# Patient Record
Sex: Male | Born: 1935 | Race: White | Hispanic: No | Marital: Married | Smoking: Former smoker
Health system: Southern US, Community
[De-identification: ages and names within clinical notes are randomized; demographics above are authoritative.]

## PROBLEM LIST (undated history)

## (undated) DIAGNOSIS — G7000 Myasthenia gravis without (acute) exacerbation: Secondary | ICD-10-CM

## (undated) DIAGNOSIS — R4781 Slurred speech: Secondary | ICD-10-CM

## (undated) HISTORY — DX: Myasthenia gravis without (acute) exacerbation: G70.00

## (undated) HISTORY — PX: TONSILLECTOMY: SUR1361

## (undated) HISTORY — DX: Slurred speech: R47.81

---

## 2013-10-23 ENCOUNTER — Encounter (HOSPITAL_COMMUNITY): Payer: Self-pay | Admitting: Emergency Medicine

## 2013-10-23 ENCOUNTER — Inpatient Hospital Stay (HOSPITAL_COMMUNITY)
Admission: EM | Admit: 2013-10-23 | Discharge: 2013-10-30 | DRG: 057 | Disposition: A | Discharge status: Home / Self Care | Payer: Medicare Other | Attending: Internal Medicine | Admitting: Internal Medicine

## 2013-10-23 DIAGNOSIS — G7000 Myasthenia gravis without (acute) exacerbation: Secondary | ICD-10-CM | POA: Diagnosis not present

## 2013-10-23 DIAGNOSIS — R1310 Dysphagia, unspecified: Secondary | ICD-10-CM | POA: Diagnosis not present

## 2013-10-23 DIAGNOSIS — Z79899 Other long term (current) drug therapy: Secondary | ICD-10-CM

## 2013-10-23 DIAGNOSIS — E785 Hyperlipidemia, unspecified: Secondary | ICD-10-CM

## 2013-10-23 DIAGNOSIS — H532 Diplopia: Secondary | ICD-10-CM

## 2013-10-23 DIAGNOSIS — M5382 Other specified dorsopathies, cervical region: Secondary | ICD-10-CM

## 2013-10-23 DIAGNOSIS — R490 Dysphonia: Secondary | ICD-10-CM

## 2013-10-23 LAB — BASIC METABOLIC PANEL
Anion gap: 12 (ref 5–15)
BUN: 21 mg/dL (ref 6–23)
CO2: 25 mEq/L (ref 19–32)
Calcium: 9.3 mg/dL (ref 8.4–10.5)
Chloride: 101 mEq/L (ref 96–112)
Creatinine, Ser: 1.02 mg/dL (ref 0.50–1.35)
GFR, EST AFRICAN AMERICAN: 79 mL/min — AB (ref >90)
GFR, EST NON AFRICAN AMERICAN: 68 mL/min — AB (ref >90)
Glucose, Bld: 98 mg/dL (ref 70–99)
POTASSIUM: 3.8 meq/L (ref 3.7–5.3)
Sodium: 138 mEq/L (ref 137–147)

## 2013-10-23 LAB — CBC
HCT: 43.7 % (ref 39.0–52.0)
Hemoglobin: 15.3 g/dL (ref 13.0–17.0)
MCH: 30.3 pg (ref 26.0–34.0)
MCHC: 35 g/dL (ref 30.0–36.0)
MCV: 86.5 fL (ref 78.0–100.0)
Platelets: 210 10*3/uL (ref 150–400)
RBC: 5.05 MIL/uL (ref 4.22–5.81)
RDW: 13.5 % (ref 11.5–15.5)
WBC: 6.6 10*3/uL (ref 4.0–10.5)

## 2013-10-23 NOTE — ED Notes (Signed)
Pt reports difficulty swallowing x 1 week, slurred speech onset yesterday, neck pain x 3 weeks.  No facial droop, equal grips, tongue midline.

## 2013-10-24 ENCOUNTER — Emergency Department (HOSPITAL_COMMUNITY): Payer: Medicare Other

## 2013-10-24 ENCOUNTER — Encounter (HOSPITAL_COMMUNITY): Payer: Self-pay | Admitting: Student

## 2013-10-24 DIAGNOSIS — Z79899 Other long term (current) drug therapy: Secondary | ICD-10-CM | POA: Diagnosis not present

## 2013-10-24 DIAGNOSIS — R1310 Dysphagia, unspecified: Secondary | ICD-10-CM

## 2013-10-24 DIAGNOSIS — G7000 Myasthenia gravis without (acute) exacerbation: Secondary | ICD-10-CM | POA: Diagnosis present

## 2013-10-24 DIAGNOSIS — E785 Hyperlipidemia, unspecified: Secondary | ICD-10-CM | POA: Diagnosis present

## 2013-10-24 LAB — CBC
HEMATOCRIT: 47 % (ref 39.0–52.0)
Hemoglobin: 16.3 g/dL (ref 13.0–17.0)
MCH: 30.1 pg (ref 26.0–34.0)
MCHC: 34.7 g/dL (ref 30.0–36.0)
MCV: 86.7 fL (ref 78.0–100.0)
Platelets: 236 10*3/uL (ref 150–400)
RBC: 5.42 MIL/uL (ref 4.22–5.81)
RDW: 13.6 % (ref 11.5–15.5)
WBC: 9.1 10*3/uL (ref 4.0–10.5)

## 2013-10-24 LAB — HEPATIC FUNCTION PANEL
ALBUMIN: 4.2 g/dL (ref 3.5–5.2)
ALT: 22 U/L (ref 0–53)
AST: 40 U/L — AB (ref 0–37)
Alkaline Phosphatase: 102 U/L (ref 39–117)
TOTAL PROTEIN: 7.9 g/dL (ref 6.0–8.3)
Total Bilirubin: 0.7 mg/dL (ref 0.3–1.2)

## 2013-10-24 LAB — CREATININE, SERUM
Creatinine, Ser: 1.01 mg/dL (ref 0.50–1.35)
GFR calc Af Amer: 79 mL/min — ABNORMAL LOW (ref >90)
GFR, EST NON AFRICAN AMERICAN: 69 mL/min — AB (ref >90)

## 2013-10-24 LAB — TSH: TSH: 2.89 u[IU]/mL (ref 0.350–4.500)

## 2013-10-24 MED ORDER — SODIUM CHLORIDE 0.9 % IV SOLN
INTRAVENOUS | Status: DC
  Administered 2013-10-24 – 2013-10-25 (×2): via INTRAVENOUS

## 2013-10-24 MED ORDER — ASPIRIN EC 81 MG PO TBEC
81.0000 mg | DELAYED_RELEASE_TABLET | Freq: Every day | ORAL | Status: DC
  Administered 2013-10-24 – 2013-10-30 (×5): 81 mg via ORAL
  Filled 2013-10-24 (×5): qty 1

## 2013-10-24 MED ORDER — SENNOSIDES-DOCUSATE SODIUM 8.6-50 MG PO TABS: 1.0000 | ORAL_TABLET | Freq: Every evening | ORAL | Status: DC | PRN

## 2013-10-24 MED ORDER — SODIUM CHLORIDE 0.9 % IV SOLN: INTRAVENOUS | Status: DC

## 2013-10-24 MED ORDER — ALUM & MAG HYDROXIDE-SIMETH 200-200-20 MG/5ML PO SUSP: 30.0000 mL | Freq: Four times a day (QID) | ORAL | Status: DC | PRN

## 2013-10-24 MED ORDER — ACETAMINOPHEN 325 MG PO TABS: 650.0000 mg | ORAL_TABLET | Freq: Four times a day (QID) | ORAL | Status: DC | PRN

## 2013-10-24 MED ORDER — PYRIDOSTIGMINE BROMIDE 60 MG PO TABS
60.0000 mg | ORAL_TABLET | Freq: Three times a day (TID) | ORAL | Status: DC
  Administered 2013-10-24: 60 mg via ORAL
  Filled 2013-10-24 (×5): qty 1

## 2013-10-24 MED ORDER — PNEUMOCOCCAL VAC POLYVALENT 25 MCG/0.5ML IJ INJ
0.5000 mL | INJECTION | INTRAMUSCULAR | Status: AC
  Administered 2013-10-25: 0.5 mL via INTRAMUSCULAR

## 2013-10-24 MED ORDER — ONDANSETRON HCL 4 MG/2ML IJ SOLN: 4.0000 mg | Freq: Four times a day (QID) | INTRAMUSCULAR | Status: DC | PRN

## 2013-10-24 MED ORDER — ACETAMINOPHEN 650 MG RE SUPP: 650.0000 mg | Freq: Four times a day (QID) | RECTAL | Status: DC | PRN

## 2013-10-24 MED ORDER — ONDANSETRON HCL 4 MG PO TABS: 4.0000 mg | ORAL_TABLET | Freq: Four times a day (QID) | ORAL | Status: DC | PRN

## 2013-10-24 MED ORDER — ENOXAPARIN SODIUM 40 MG/0.4ML ~~LOC~~ SOLN
40.0000 mg | SUBCUTANEOUS | Status: DC
  Administered 2013-10-24 – 2013-10-29 (×6): 40 mg via SUBCUTANEOUS
  Filled 2013-10-24 (×6): qty 0.4

## 2013-10-24 NOTE — Progress Notes (Signed)
Pt came in pacing with difficulty about staying. Wife emotional

## 2013-10-24 NOTE — ED Notes (Signed)
Dr. Denton LankSteinl made aware of continued swallowing difficulties. Apple sauce sticks back their. Gags, and talks.  Based on Steinl's evaluation of our swallow eval. And delay in follow up, pt. To be admitted.

## 2013-10-24 NOTE — Evaluation (Addendum)
Clinical/Bedside Swallow Evaluation Patient Details  Name: Joe PernaByron Palos MRN: [id number] Date of Birth: 10-23-1932  Today's Date: 10/24/2013 Time: 1710-1757 SLP Time Calculation (min): 47 min  Past Medical History: History reviewed. No pertinent past medical history. Past Surgical History:  Past Surgical History  Procedure Laterality Date  . Tonsillectomy     HPI:  Joe Fisher is an 80 y.o. male who noted 3-4 weeks ago he was having a hard time keeping his head held in the upright position.  He states he is able to lift his head but after a while it will "droop forward".  Wife states he spends most of the day with his head flexed forward. Over the last 2 weeks wife has noted he has been having difficulty swallowing solid foods to the point she would chop all his meats up finely for fear he may choke. As patient will denise all symptoms asked about, most of further history was obtained from wife. She endorses that he has had periods of double vision which he states objects are vertically stacked in addition he has complained about his "jaw becoming tired the more he will eat food".  Both patient and wife denies any periods of SOB, weakness in legs or inability to climb stairs or do physical activity. Currently patient states he feels fine and desires to go home   Assessment / Plan / Recommendation Clinical Impression  Patient is currently unable to swallow purees or solids, with purees sticking in throat and pt gagging each bolus back into the oral cavity.  Suspect laryngeal residue with thicker boluses due to decreased base of tongue contraction to the phayrngeal wall to propel the bolus through the crichopharyngeus.  Pt is able to clear small sips of water with double swallows without overt s/s of aspiration.  Pt may have small sips of H2O or an occassional ice chip, but otherwise, recommend NPO.  SLP noted that pt is unable to bite his teeth together, and becomes quickly fatigue when attempting  to chew, even to the point of manually closing his jaw with his fist, similar to myasthenia gravis.  Discussed with Neurology.  SLP will follow up 10/13 for potential readiness for po's.  He will most likely need MBS for objective study of swallow function prior to placement on diet.    Aspiration Risk  Severe   Diet Recommendation NPO;Ice chips PRN after oral care   Liquid Administration via: Spoon Medication Administration: Via alternative means    Other  Recommendations Recommended Consults: MBS Oral Care Recommendations: Oral care Q4 per protocol   Follow Up Recommendations  24 hour supervision/assistance    Frequency and Duration min 2x/week  2 weeks   Pertinent Vitals/Pain Neck pain secondary to head drop.        Swallow Study Prior Functional Status       General HPI: Joe PernaByron Matkins is an 80 y.o. male who noted 3-4 weeks ago he was having a hard time keeping his head held in the upright position.  He states he is able to lift his head but after a while it will "droop forward".  Wife states he spends most of the day with his head flexed forward. Over the last 2 weeks wife has noted he has been having difficulty swallowing solid foods to the point she would chop all his meats up finely for fear he may choke. As patient will denise all symptoms asked about, most of further history was obtained from wife. She endorses that he has  had periods of double vision which he states objects are vertically stacked in addition he has complained about his "jaw becoming tired the more he will eat food".  Both patient and wife denies any periods of SOB, weakness in legs or inability to climb stairs or do physical activity. Currently patient states he feels fine and desires to go home Type of Study: Bedside swallow evaluation Previous Swallow Assessment: none Diet Prior to this Study: NPO (sips/chips) Temperature Spikes Noted: No Respiratory Status: Room air History of Recent Intubation:  No Behavior/Cognition: Alert;Cooperative;Pleasant mood Oral Cavity - Dentition: Adequate natural dentition Self-Feeding Abilities: Able to feed self Patient Positioning: Upright in bed Baseline Vocal Quality: Clear Volitional Cough: Weak Volitional Swallow: Able to elicit    Oral/Motor/Sensory Function Overall Oral Motor/Sensory Function: Impaired Labial ROM: Within Functional Limits Labial Symmetry: Within Functional Limits Labial Strength: Within Functional Limits Lingual ROM: Reduced right;Reduced left (unable to elevate tongue tip) Lingual Symmetry: Within Functional Limits Lingual Strength: Reduced Lingual Sensation: Within Functional Limits Facial ROM: Within Functional Limits Facial Symmetry: Right drooping eyelid;Left drooping eyelid Facial Strength: Reduced Facial Sensation: Within Functional Limits Velum: Impaired right;Impaired left (hypernasal) Mandible: Impaired (unable to bite teeth together, fatigue with chewing)   Ice Chips Ice chips: Within functional limits Presentation: Spoon   Thin Liquid Thin Liquid: Impaired Presentation: Spoon;Cup;Straw Pharyngeal  Phase Impairments: Decreased hyoid-laryngeal movement;Multiple swallows;Throat Clearing - Immediate (c/o pharyngeal residue)    Nectar Thick Nectar Thick Liquid: Not tested   Honey Thick Honey Thick Liquid: Not tested   Puree Puree: Impaired Presentation: Spoon Pharyngeal Phase Impairments: Decreased hyoid-laryngeal movement;Multiple swallows;Throat Clearing - Delayed (brings bolus back into oral cavity after swallow attempts)   Solid   GO    Solid: Not tested       Maryjo RochesterWillis, Rheannon Cerney T 10/24/2013,5:57 PM

## 2013-10-24 NOTE — Progress Notes (Signed)
Nif= -65 Vc= 6.5L

## 2013-10-24 NOTE — ED Provider Notes (Addendum)
Signed out by Dr Wilkie AyeHorton at 0800 that pt presents w mild dysphonia and intermittent trouble swallowing solid foods in past few weeks, without o acute or abrupt change today, and that MRI was pending and to d/c to home if/when MRI neg for acute cva.  MRI negative.  Pt states is able to eat/drink fine, although notes occasional trouble whereby solid foods will feel they transiently get hung up or stuck.  No current throat or esophageal fb sensation, and is swallowing and handling secretions without difficulty. Pt denies sense of neck or throat swelling or pain. No tongue swelling. Denies sob. Denies choking or gagging.   Pt states speech has sounded slightly different during this period, but no expressive aphasia or slurred/dysarthric speech. No extremity numbness/weakness, no change in gait, no tremors, no loss of normal functional ability.  On repeat questioning, pt states has occasionally noticed 'double vision', although not consistently so, and spouse adds some recent trouble holding head up, that on occasion it will nod forward. On exam, no ptosis, normal eom. No focal weakness. Pt ambulates w steady gait.   Discussed w pt and spouse MRI neg for cva.  Discussed diff dx incl motor neuron disease, MG, and variety of ENT/GI causes of dysphagia/dysphonia. Will consult neuro re symptoms,possible/suspected MG.  If neuro workup neg, will need outpatient ENT/GI eval for dysphagia/dysphonia.   Discussed pt with Dr Reynolds/neurology including concern for MG - she indicates further neuro eval, outpatient workup.  On recheck pt, pt fails swallow screen, and not able to swallow solid, apple sauce, etc. Spouse concerned re not getting nutrition and feels swallowing getting worse.   Will call med service for further inpatient eval, incl neuro eval.        Joe RootsKevin E Geoge Lawrance, MD 10/24/13 1106

## 2013-10-24 NOTE — Progress Notes (Signed)
MD: Patient failed bedside swallow eval per speech. Called Pharmacy to discuss and see if patient could be changed to IV/IM form. Pharmacist stated that it's more dangerous to give in those forms and to consult with MD prior to changing. Patient pulled out IV access and refuses restart. At the current time he is fixated on going home. Nurse will continue to monitor and report to am nurse for follow up with MD.

## 2013-10-24 NOTE — ED Provider Notes (Signed)
CSN: [id number]     Arrival date & time 10/23/13  1829 History   First MD Initiated Contact with Patient 10/23/13 2337     Chief Complaint  Patient presents with  . Dysphagia  . Aphasia  . Neck Pain     (Consider location/radiation/quality/duration/timing/severity/associated sxs/prior Treatment) HPI  This is a 80 year old male who presents with dysphasia and slurred speech. Onset of symptoms 3 weeks ago.  Patient reports difficulty swallowing solids started approximately 3 weeks ago. He denies any difficulties swallowing liquids. Wife is noted over the last 3 days that he has had slurred speech. Patient denies any unilateral weakness, numbness, or tingling. He denies any vision changes. Patient does report waxing and waning neck pain during this time but states he's not having any neck pain right now. Patient denies any sore throat or pain with swallowing. Patient has a history of hyperlipidemia and is on Crestor but otherwise has no history of stroke, hypertension, coronary artery disease.  History reviewed. No pertinent past medical history. Past Surgical History  Procedure Laterality Date  . Tonsillectomy     No family history on file. History  Substance Use Topics  . Smoking status: Never Smoker   . Smokeless tobacco: Not on file  . Alcohol Use: No    Review of Systems  Constitutional: Negative.  Negative for fever.  Respiratory: Negative.  Negative for chest tightness and shortness of breath.   Cardiovascular: Negative.  Negative for chest pain.  Gastrointestinal: Negative.  Negative for nausea, vomiting, abdominal pain and diarrhea.       Difficulty swallowing  Genitourinary: Negative.  Negative for dysuria.  Musculoskeletal: Positive for neck pain. Negative for back pain.  Skin: Negative for rash.  Neurological: Positive for speech difficulty. Negative for headaches.  Psychiatric/Behavioral: Negative for confusion.  All other systems reviewed and are  negative.     Allergies  Review of patient's allergies indicates no known allergies.  Home Medications   Prior to Admission medications   Medication Sig Start Date End Date Taking? Authorizing Provider  CALCIUM PO Take 1 tablet by mouth daily.   Yes Historical Provider, MD  rosuvastatin (CRESTOR) 10 MG tablet Take 10 mg by mouth daily.   Yes Historical Provider, MD   BP 129/69  Pulse 58  Temp(Src) 98.1 F (36.7 C) (Oral)  Resp 17  Ht 6\' 2"  (1.88 m)  Wt 182 lb (82.555 kg)  BMI 23.36 kg/m2  SpO2 97% Physical Exam  Nursing note and vitals reviewed. Constitutional: He is oriented to person, place, and time. He appears well-developed and well-nourished. No distress.  HENT:  Head: Normocephalic and atraumatic.  Mouth/Throat: Oropharynx is clear and moist.  Uvula midline, no oropharyngeal swelling noted  Eyes: Pupils are equal, round, and reactive to light.  Neck: Neck supple. No thyromegaly present.  Cardiovascular: Normal rate, regular rhythm and normal heart sounds.   No murmur heard. Pulmonary/Chest: Effort normal and breath sounds normal. No respiratory distress. He has no wheezes.  Abdominal: Soft. Bowel sounds are normal. There is no tenderness. There is no rebound.  Musculoskeletal: He exhibits no edema.  Lymphadenopathy:    He has no cervical adenopathy.  Neurological: He is alert and oriented to person, place, and time.  5 out of 5 strength in all 4 extremities, cranial nerves II through XII intact, no dysmetria to finger-nose-finger, patient fluent, he can name and repeat  Skin: Skin is warm and dry.  Psychiatric: He has a normal mood and affect.  ED Course  Procedures (including critical care time) Labs Review Labs Reviewed  BASIC METABOLIC PANEL - Abnormal; Notable for the following:    GFR calc non Af Amer 68 (*)    GFR calc Af Amer 79 (*)    All other components within normal limits  CBC    Imaging Review Ct Head Wo Contrast  10/24/2013   CLINICAL  DATA:  Difficulty swallowing for 1 week. Acute onset of slurred speech yesterday. Neck pain for 3 weeks. Initial encounter.  EXAM: CT HEAD WITHOUT CONTRAST  TECHNIQUE: Contiguous axial images were obtained from the base of the skull through the vertex without intravenous contrast.  COMPARISON:  None.  FINDINGS: There is mild cerebral and cerebellar atrophy with diffuse sulcal prominence. Gray-white differentiation is grossly preserved. No CT evidence of acute large territory infarct. No intraparenchymal or extra-axial mass or hemorrhage. Normal size and configuration of the ventricles and basilar cisterns. No midline shift.  Limited visualization of the paranasal sinuses is normal. There is scattered opacification of the bilateral mastoid air cells, right greater than left.  There is slightly nodular asymmetric soft tissue thickening about the right parietal calvarium (image 21, series 2). This finding without associated radiopaque foreign body or displaced calvarial fracture.  IMPRESSION: 1. Mild atrophy without acute intracranial process. 2. Bilateral mastoid air cell effusions, right greater than left - nonspecific though could be seen in the setting of mastoiditis.   Electronically Signed   By: Simonne ComeJohn  Watts M.D.   On: 10/24/2013 00:39     EKG Interpretation   Date/Time:  Monday October 24 2013 00:14:15 EDT Ventricular Rate:  61 PR Interval:  184 QRS Duration: 107 QT Interval:  427 QTC Calculation: 430 R Axis:   14 Text Interpretation:  Sinus rhythm No prior for comparison Confirmed by  HORTON  MD, COURTNEY ([id number]) on 10/24/2013 12:37:16 AM      MDM   Final diagnoses:  None    Patient presents with difficulty swallowing and slurred speech. He is nontoxic on exam. No obvious focal abnormalities. I do not appreciate any dysarthria; however, patient's wife is at the bedside and states that his speech is different than normal.  Considerations include stroke versus achalasia causing difficulty  swallowing.  Initial workup including CBC, BMP, and noncontrasted head CT is reassuring. Discuss with Dr. Leroy Kennedyamilo, on-call neurologist. Given that there is speech disturbance in addition to difficulty swallowing and symptoms are ongoing, would elect to get MRI. If negative. Patient will likely need to followup with GI to be evaluated for achalasia.  Patient is hesitant to stay.  Discuss with him the risk of leaving without getting an MRI.  Patient is agreeable to stay in emergency department overnight for MRI in the morning.  Signed out to Dr. Arizona ConstableStienl.    Shon Batonourtney F Horton, MD 10/24/13 [id number]

## 2013-10-24 NOTE — Discharge Instructions (Signed)
It was our pleasure to provide your ER care today - we hope that you feel better.  We discussed your case with our neurologist, who indicates for you to follow up with neurology as outpatient in the coming week - see referral - call office today arrange appointment.  When you call, tell them that you were in the ER today for evaluation of trouble swallowing and change in voice, have had some recent double vision, that your case was discussed with our neurologist, and that your case was discussed with our neurologist who recommended close outpatient neurology evaluation (to rule out myasthenia gravis, and/or other neurologic causes of your symptoms).   Also follow up with gi doctor this week regarding your swallowing difficulties - see referral - call office today to arrange appointment.  Return to ER right away if worse, trouble breathing, unable to swallow, new neurological symptoms (one-sided numbness or weakness, trouble walking, new change in vision/speech), other concern.        Dysphagia Swallowing problems (dysphagia) occur when solids and liquids seem to stick in your throat on the way down to your stomach, or the food takes longer to get to the stomach. Other symptoms include regurgitating food, noises coming from the throat, chest discomfort with swallowing, and a feeling of fullness or the feeling of something being stuck in your throat when swallowing. When blockage in your throat is complete, it may be associated with drooling. CAUSES  Problems with swallowing may occur because of problems with the muscles. The food cannot be propelled in the usual manner into your stomach. You may have ulcers, scar tissue, or inflammation in the tube down which food travels from your mouth to your stomach (esophagus), which blocks food from passing normally into the stomach. Causes of inflammation include:  Acid reflux from your stomach into your esophagus.  Infection.  Radiation treatment for  cancer.  Medicines taken without enough fluids to wash them down into your stomach. You may have nerve problems that prevent signals from being sent to the muscles of your esophagus to contract and move your food down to your stomach. Globus pharyngeus is a relatively common problem in which there is a sense of an obstruction or difficulty in swallowing, without any physical abnormalities of the swallowing passages being found. This problem usually improves over time with reassurance and testing to rule out other causes. DIAGNOSIS Dysphagia can be diagnosed and its cause can be determined by tests in which you swallow a white substance that helps illuminate the inside of your throat (contrast medium) while X-rays are taken. Sometimes a flexible telescope that is inserted down your throat (endoscopy) to look at your esophagus and stomach is used. TREATMENT   If the dysphagia is caused by acid reflux or infection, medicines may be used.  If the dysphagia is caused by problems with your swallowing muscles, swallowing therapy may be used to help you strengthen your swallowing muscles.  If the dysphagia is caused by a blockage or mass, procedures to remove the blockage may be done. HOME CARE INSTRUCTIONS  Try to eat soft food that is easier to swallow and check your weight on a daily basis to be sure that it is not decreasing.  Be sure to drink liquids when sitting upright (not lying down). SEEK MEDICAL CARE IF:  You are losing weight because you are unable to swallow.  You are coughing when you drink liquids (aspiration).  You are coughing up partially digested food. SEEK IMMEDIATE MEDICAL  CARE IF:  You are unable to swallow your own saliva .  You are having shortness of breath or a fever, or both.  You have a hoarse voice along with difficulty swallowing. MAKE SURE YOU:  Understand these instructions.  Will watch your condition.  Will get help right away if you are not doing well  or get worse. Document Released: 12/28/1999 Document Revised: 05/16/2013 Document Reviewed: 06/18/2012 [facility] Patient Information 2015 [employer] [place]. This information is not intended to replace advice given to you by your health care provider. Make sure you discuss any questions you have with your health care provider.

## 2013-10-24 NOTE — Consult Note (Addendum)
NEURO HOSPITALIST CONSULT NOTE    Reason for Consult: difficulty swallowing over a 4 day period.   HPI:                                                                                                                                          Joe Fisher is an 80 y.o. male who noted 3-4 weeks ago he was having a hard time keeping his head held in the upright position.  He states he is able to lift his head but after a while it will "droop forward".  Wife states he spends most of the day with his head flexed forward. Over the last 2 weeks wife has noted he has been having difficulty swallowing solid foods to the point she would chop all his meats up finely for fear he may choke. As patient will denise all symptoms asked about, most of further history was obtained from wife. She endorses that he has had periods of double vision which he states objects are vertically stacked in addition he has complained about his "jaw becoming tired the more he will eat food".  Both patient and wife denies any periods of SOB, weakness in legs or inability to climb stairs or do physical activity. Currently patient states he feels fine and desires to go home.   History reviewed. No pertinent past medical history.  Past Surgical History  Procedure Laterality Date  . Tonsillectomy      Family History  Problem Relation Age of Onset  . Myasthenia gravis Mother    Social History:  reports that he has never smoked. He does not have any smokeless tobacco history on file. He reports that he does not drink alcohol or use illicit drugs.  No Known Allergies  MEDICATIONS:                                                                                                                     No current facility-administered medications for this encounter.   Current Outpatient Prescriptions  Medication Sig Dispense Refill  . CALCIUM PO Take 1 tablet by mouth daily.      . rosuvastatin (CRESTOR) 10 MG  tablet Take 10 mg by mouth daily.          ROS:  History obtained from the patient and and wife  General ROS: negative for - chills, fatigue, fever, night sweats, weight gain or weight loss Psychological ROS: negative for - behavioral disorder, hallucinations, memory difficulties, mood swings or suicidal ideation Ophthalmic ROS: negative for - blurry vision, double vision, eye pain or loss of vision ENT ROS: negative for - epistaxis, nasal discharge, oral lesions, sore throat, tinnitus or vertigo Allergy and Immunology ROS: negative for - hives or itchy/watery eyes Hematological and Lymphatic ROS: negative for - bleeding problems, bruising or swollen lymph nodes Endocrine ROS: negative for - galactorrhea, hair pattern changes, polydipsia/polyuria or temperature intolerance Respiratory ROS: negative for - cough, hemoptysis, shortness of breath or wheezing Cardiovascular ROS: negative for - chest pain, dyspnea on exertion, edema or irregular heartbeat Gastrointestinal ROS: negative for - abdominal pain, diarrhea, hematemesis, nausea/vomiting or stool incontinence Genito-Urinary ROS: negative for - dysuria, hematuria, incontinence or urinary frequency/urgency Musculoskeletal ROS: negative for - joint swelling or muscular weakness Neurological ROS: as noted in HPI Dermatological ROS: negative for rash and skin lesion changes   Blood pressure 125/87, pulse 93, temperature 98 F (36.7 C), temperature source Oral, resp. rate 20, height 6\' 2"  (1.88 m), weight 82.555 kg (182 lb), SpO2 99.00%.   Neurologic Examination:                                                                                                      General: NAD Mental Status: Alert, oriented, thought content appropriate.  Speech fluent without evidence of aphasia--at times it appears he slurs  his speech but this is intermittent.  Able to follow 3 step commands without difficulty. Cranial Nerves: II: Discs flat bilaterally; Visual fields grossly normal, pupils equal, round, reactive to light and accommodation III,IV, VI: ptosis not present --but after having him hold vertical gaze for 30 seconds his right eyelid showed ptosis, extra-ocular motions intact bilaterally V,VII: smile symmetric, facial light touch sensation normal bilaterally VIII: hearing normal bilaterally IX,X: gag reflex present XI: bilateral shoulder shrug XII: midline tongue extension without atrophy or fasciculations --Patient holds head downward but gives full strength on neck flexion and extension   Motor: Right : Upper extremity   5/5    Left:     Upper extremity   5/5  Lower extremity   5/5     Lower extremity   5/5  --with fist pumping there is notable there is a notable fatigability.  Tone and bulk:normal tone throughout; no atrophy noted Sensory: Pinprick and light touch intact throughout, bilaterally Deep Tendon Reflexes:  Right: Upper Extremity   Left: Upper extremity   biceps (C-5 to C-6) 2/4   biceps (C-5 to C-6) 2/4 tricep (C7) 2/4    triceps (C7) 2/4 Brachioradialis (C6) 2/4  Brachioradialis (C6) 2/4  Lower Extremity Lower Extremity  quadriceps (L-2 to L-4) 2/4   quadriceps (L-2 to L-4) 2/4 Achilles (S1) 2/4   Achilles (S1) 2/4  Plantars: Right: downgoing   Left: downgoing Cerebellar: normal finger-to-nose,  normal heel-to-shin test Gait: normal CV: pulses palpable throughout    Lab Results: Basic  Metabolic Panel:  Recent Labs Lab 10/23/13 1844  NA 138  K 3.8  CL 101  CO2 25  GLUCOSE 98  BUN 21  CREATININE 1.02  CALCIUM 9.3    Liver Function Tests: No results found for this basename: AST, ALT, ALKPHOS, BILITOT, PROT, ALBUMIN,  in the last 168 hours No results found for this basename: LIPASE, AMYLASE,  in the last 168 hours No results found for this basename: AMMONIA,   in the last 168 hours  CBC:  Recent Labs Lab 10/23/13 1844  WBC 6.6  HGB 15.3  HCT 43.7  MCV 86.5  PLT 210    Cardiac Enzymes: No results found for this basename: CKTOTAL, CKMB, CKMBINDEX, TROPONINI,  in the last 168 hours  Lipid Panel: No results found for this basename: CHOL, TRIG, HDL, CHOLHDL, VLDL, LDLCALC,  in the last 168 hours  CBG: No results found for this basename: GLUCAP,  in the last 168 hours  Microbiology: No results found for this or any previous visit.  Coagulation Studies: No results found for this basename: LABPROT, INR,  in the last 72 hours  Imaging: Ct Head Wo Contrast  10/24/2013   CLINICAL DATA:  Difficulty swallowing for 1 week. Acute onset of slurred speech yesterday. Neck pain for 3 weeks. Initial encounter.  EXAM: CT HEAD WITHOUT CONTRAST  TECHNIQUE: Contiguous axial images were obtained from the base of the skull through the vertex without intravenous contrast.  COMPARISON:  None.  FINDINGS: There is mild cerebral and cerebellar atrophy with diffuse sulcal prominence. Gray-white differentiation is grossly preserved. No CT evidence of acute large territory infarct. No intraparenchymal or extra-axial mass or hemorrhage. Normal size and configuration of the ventricles and basilar cisterns. No midline shift.  Limited visualization of the paranasal sinuses is normal. There is scattered opacification of the bilateral mastoid air cells, right greater than left.  There is slightly nodular asymmetric soft tissue thickening about the right parietal calvarium (image 21, series 2). This finding without associated radiopaque foreign body or displaced calvarial fracture.  IMPRESSION: 1. Mild atrophy without acute intracranial process. 2. Bilateral mastoid air cell effusions, right greater than left - nonspecific though could be seen in the setting of mastoiditis.   Electronically Signed   By: Simonne Come M.D.   On: 10/24/2013 00:39   Mr Brain Wo Contrast  10/24/2013    CLINICAL DATA:  Slurred speech. Dysphasia. Symptoms began 3 weeks ago. Difficulty swallowing. Speech worsening over the last 3 days.  EXAM: MRI HEAD WITHOUT CONTRAST  TECHNIQUE: Multiplanar, multiecho pulse sequences of the brain and surrounding structures were obtained without intravenous contrast.  COMPARISON:  Head CT ear earlier same day  FINDINGS: Diffusion imaging does not show any acute or subacute infarction. The brainstem is normal. No focal cerebellar insult. The cerebral hemispheres show mild age related atrophy with minimal small vessel change of the white matter, less than often seen in healthy individuals of this age. No cortical or large vessel territory infarction. No mass lesion, hemorrhage, hydrocephalus or extra-axial collection. There is an incidental and insignificant venous angioma at the right parietal vertex with a tiny amount of hemosiderin deposition. This should not be of clinical relevance. No pituitary mass. No inflammatory sinus disease. There is fluid throughout the mastoid air cells bilaterally. This is more extensive on the right.  IMPRESSION: No acute brain pathology. Minimal small vessel change of the cerebral hemispheric white matter, less than often seen in healthy individuals of this age.  Incidental and  likely insignificant venous angioma at the right parietal vertex with a small amount of hemosiderin deposition.  Fluid throughout the mastoid air cells bilaterally, right more than left.   Electronically Signed   By: Paulina FusiMark  Shogry M.D.   On: 10/24/2013 08:22     Felicie MornDavid Smith PA-C [facility] [phone]  10/24/2013, 1:57 PM  Patient seen and examined.  Clinical course and management discussed.  Necessary edits performed.  I agree with the above.  Assessment and plan of care developed and discussed below.    Assessment/Plan: 80 year old male presenting with difficulty swallowing and slurred speech.  The patient's symptoms have been present for the past  few weeks.  There was some question of stroke.  Head CT reviewed and shows no acute changes.  MRI of the brain from today reviewed as well and shows no acute changes.  On neurological examination there is some fatigueability but for the most part he is able to give full strength with all muscle groups tested.  With this is mind a tensilon test would be less than optimal.  Blood work may take quite a few days.  Patient will likely benefit from NCV/EMG which at this time can only be performed on an outpatient basis.    Recommendations: 1.  Swallow evaluation with recommendations by speech therapy 2.  Appointment to be made for NCV/EMG in the next 1-2 days (we will schedule).  Results of this testing should lead further treatment.     Thana FarrLeslie Jahaad Penado, MD [facility] [phone]  10/24/2013  3:40 PM   Addendum: Appointment with Dr. Terrace ArabiaYan [facility] on 10/14 for NCV/EMG at 0815.  Thana FarrLeslie Suraiya Dickerson, MD [facility] [phone]

## 2013-10-24 NOTE — H&P (Signed)
[facility] History and Physical                                                                                    Patient Demographics  Joe Fisher, is a 80 y.o. male  MRN: [id number]   DOB - Jan 12, 1933  Admit Date - 10/23/2013  Outpatient Primary MD for the patient is LITTLE, Murrell ReddenEDGAR W, MD   With History of -  History reviewed. No pertinent past medical history.    Past Surgical History  Procedure Laterality Date  . Tonsillectomy      in for   Chief Complaint  Patient presents with  . Dysphagia  . Aphasia  . Neck Pain     HPI  Joe PernaByron Craigo  is a 80 y.o. male, with recent diagnosis of hyperlipidemia, with no other significant past medical history who presented to the ED with difficulty swallowing foods, slurring of speech, memory loss, and neck stiffness x 2 weeks. While in the ED patient also mentioned a moment of double vision, which is new to him.  Today, he is having difficulty swallowing applesauce, resulting in him gagging per nurse documentation. He denies problems with swallowing applesauce, however his wife reports this has worsened. Wife also reports that his head drops to his chest most of the time. He is able to move his head without difficulty, but does complain of neck stiffness at times. Workup in the ED, including MRI and CT of the head revealed no stroke.      Review of Systems    In addition to the HPI above, No Fever-chills, No Headache, No Chest pain, Cough or Shortness of Breath, No Abdominal pain, No Nausea or Vomiting, Bowel movements are regular, No Blood in stool or Urine, No dysuria, No new skin rashes or bruises, No new joints pains-aches,  No recent weight gain or loss, No polyuria, polydypsia or polyphagia,  A full 10 point Review of Systems was done, except as stated above, all other Review of Systems were negative.   Social History History  Substance Use Topics  . Smoking status: Never Smoker   . Smokeless tobacco: Not on  file  . Alcohol Use: No   Has a 30 pack year of tobacco use. Reports a history of heavy alcohol use, but stopped drinking many years ago.  Family History Mother has a history of myasthenia gravis  Prior to Admission medications   Medication Sig Start Date End Date Taking? Authorizing Provider  CALCIUM PO Take 1 tablet by mouth daily.   Yes Historical Provider, MD  rosuvastatin (CRESTOR) 10 MG tablet Take 10 mg by mouth daily.   Yes Historical Provider, MD    No Known Allergies  Physical Exam  Vitals  Blood pressure 125/87, pulse 93, temperature 98 F (36.7 C), temperature source Oral, resp. rate 20, height 6\' 2"  (1.88 m), weight 82.555 kg (182 lb), SpO2 99.00%.   General: Tall elderly male, Patient slightly agitated, pacing, in NAD, head hanging forward.  Psych: Patient somewhat confused, able to follow demands, Not Suicidal or Homicidal, Awake Alert, Oriented X 3.  Neuro:   No F.N deficits, speech affected, slightly dysarthric. Strength 5/5 all  4 extremities, Sensation intact all 4 extremities. Eyelids weakened. Patient unable to keep them closed against force.  HEENT:  Ears and Eyes appear Normal, Conjunctivae clear, Moist Oral Mucosa. Lower jaw relaxed, and mouth open more than normal.  Neck:  Supple Neck, No JVD, No cervical lymphadenopathy appreciated. Patient holding his head in a downward position through most of the exam.   Respiratory: Symmetrical Chest wall movement, Good air movement bilaterally, Clear to auscultation bilaterally with no wheezes, rhonchi, or rales.  Cardiac:  RRR, S1 and S2 normal, No Gallops, Rubs or Murmurs.  Abdomen:  Positive Bowel Sounds, Abdomen Soft, Non tender, No organomegaly appreciated  Extremities:  Good muscle tone,  joints appear normal , no effusions, Normal ROM.    Data Review  CBC  Recent Labs Lab 10/23/13 1844  WBC 6.6  HGB 15.3  HCT 43.7  PLT 210  MCV 86.5  MCH 30.3  MCHC 35.0  RDW 13.5    ------------------------------------------------------------------------------------------------------------------  Chemistries   Recent Labs Lab 10/23/13 1844  NA 138  K 3.8  CL 101  CO2 25  GLUCOSE 98  BUN 21  CREATININE 1.02  CALCIUM 9.3     Imaging results:   Ct Head Wo Contrast  10/24/2013   CLINICAL DATA:  Difficulty swallowing for 1 week. Acute onset of slurred speech yesterday. Neck pain for 3 weeks. Initial encounter.  EXAM: CT HEAD WITHOUT CONTRAST  TECHNIQUE: Contiguous axial images were obtained from the base of the skull through the vertex without intravenous contrast.  COMPARISON:  None.  FINDINGS: There is mild cerebral and cerebellar atrophy with diffuse sulcal prominence. Gray-white differentiation is grossly preserved. No CT evidence of acute large territory infarct. No intraparenchymal or extra-axial mass or hemorrhage. Normal size and configuration of the ventricles and basilar cisterns. No midline shift.  Limited visualization of the paranasal sinuses is normal. There is scattered opacification of the bilateral mastoid air cells, right greater than left.  There is slightly nodular asymmetric soft tissue thickening about the right parietal calvarium (image 21, series 2). This finding without associated radiopaque foreign body or displaced calvarial fracture.  IMPRESSION: 1. Mild atrophy without acute intracranial process. 2. Bilateral mastoid air cell effusions, right greater than left - nonspecific though could be seen in the setting of mastoiditis.   Electronically Signed   By: Simonne ComeJohn  Watts M.D.   On: 10/24/2013 00:39   Mr Brain Wo Contrast  10/24/2013   CLINICAL DATA:  Slurred speech. Dysphasia. Symptoms began 3 weeks ago. Difficulty swallowing. Speech worsening over the last 3 days.  EXAM: MRI HEAD WITHOUT CONTRAST  TECHNIQUE: Multiplanar, multiecho pulse sequences of the brain and surrounding structures were obtained without intravenous contrast.  COMPARISON:   Head CT ear earlier same day  FINDINGS: Diffusion imaging does not show any acute or subacute infarction. The brainstem is normal. No focal cerebellar insult. The cerebral hemispheres show mild age related atrophy with minimal small vessel change of the white matter, less than often seen in healthy individuals of this age. No cortical or large vessel territory infarction. No mass lesion, hemorrhage, hydrocephalus or extra-axial collection. There is an incidental and insignificant venous angioma at the right parietal vertex with a tiny amount of hemosiderin deposition. This should not be of clinical relevance. No pituitary mass. No inflammatory sinus disease. There is fluid throughout the mastoid air cells bilaterally. This is more extensive on the right.  IMPRESSION: No acute brain pathology. Minimal small vessel change of the cerebral hemispheric white matter,  less than often seen in healthy individuals of this age.  Incidental and likely insignificant venous angioma at the right parietal vertex with a small amount of hemosiderin deposition.  Fluid throughout the mastoid air cells bilaterally, right more than left.   Electronically Signed   By: Paulina Fusi M.D.   On: 10/24/2013 08:22    My personal review of EKG: Rhythm NSR, Rate  61 /min, no Acute ST changes    Assessment & Plan Progressively worsening dysphagia, facial muscle and neck muscle weakness - likely secondary to myasthenia gravis (MRI brain negative for CVA) - Acetylcholine receptor antibodies, and anti-striated antibodies ordered. Neurology consultation pending. Suspect will need to be started on Mestinon. Have ordered  NIF/VC in the ED. Swallow evaluation and physical therapy evaluation will be ordered.  Hyperlipidemia -Recent diagnosis and medication -Hold Crestor while NPO  Social -Wife recovering from a broken hip (may need help at home) -Death of son in 19-May-2013 DVT Prophylaxis: Lovenox  AM Labs Ordered, also please  review Full Orders  Family Communication:   Wife at bedside   Code Status:  Full  Likely DC to  Home  Condition:  guarded  Time spent in minutes : 60  Elvis Coil PA-S2 on 10/24/2013 at 1:10 PM Algis Downs, PA-C  Between 7am to 7pm - Pager [phone]  After 7pm go to [email or web address] - password [email or web address]  And look for the night coverage person covering me after hours  [facility] Office  [phone]  Attending Patient was seen, examined,treatment plan was discussed with the  Advance Practice Provider.  I have directly reviewed the clinical findings, lab, imaging studies and management of this patient in detail. I have made the necessary changes to the above noted documentation, and agree with the documentation, as recorded by the Advance Practice Provider.   Progress of facial muscles, neck muscle weakness with dysphagia. Suspected myasthenia gravis-will start Mestinon. Await neurology evaluation. Myasthenia lab panel ordered.  Windell Norfolk MD [facility].

## 2013-10-25 ENCOUNTER — Inpatient Hospital Stay (HOSPITAL_COMMUNITY): Payer: Medicare Other

## 2013-10-25 DIAGNOSIS — M5382 Other specified dorsopathies, cervical region: Secondary | ICD-10-CM

## 2013-10-25 DIAGNOSIS — G7000 Myasthenia gravis without (acute) exacerbation: Secondary | ICD-10-CM | POA: Diagnosis not present

## 2013-10-25 DIAGNOSIS — H532 Diplopia: Secondary | ICD-10-CM

## 2013-10-25 DIAGNOSIS — R490 Dysphonia: Secondary | ICD-10-CM

## 2013-10-25 LAB — FOLATE RBC: RBC FOLATE: 821 ng/mL — AB (ref >280)

## 2013-10-25 LAB — VITAMIN B12: Vitamin B-12: 1389 pg/mL — ABNORMAL HIGH (ref 211–911)

## 2013-10-25 LAB — RPR

## 2013-10-25 MED ORDER — IMMUNE GLOBULIN (HUMAN) 10 GM/200ML IV SOLN: 400.0000 mg/kg | INTRAVENOUS | Status: DC

## 2013-10-25 MED ORDER — IMMUNE GLOBULIN (HUMAN) 10 GM/100ML IV SOLN
400.0000 mg/kg | INTRAVENOUS | Status: AC
  Administered 2013-10-25 – 2013-10-29 (×5): 30 g via INTRAVENOUS
  Filled 2013-10-25 (×5): qty 300

## 2013-10-25 MED ORDER — PYRIDOSTIGMINE BROMIDE 10 MG/2ML IV SOLN
2.0000 mg | Freq: Three times a day (TID) | INTRAVENOUS | Status: DC
  Administered 2013-10-25 – 2013-10-26 (×3): 2 mg via INTRAVENOUS
  Filled 2013-10-25 (×6): qty 0.4

## 2013-10-25 MED ORDER — DEXTROSE-NACL 5-0.45 % IV SOLN
INTRAVENOUS | Status: DC
  Administered 2013-10-25 – 2013-10-26 (×2): via INTRAVENOUS
  Administered 2013-10-27: 1000 mL via INTRAVENOUS

## 2013-10-25 MED ORDER — DIPHENHYDRAMINE HCL 50 MG/ML IJ SOLN
12.5000 mg | INTRAMUSCULAR | Status: DC
  Administered 2013-10-25 – 2013-10-29 (×5): 12.5 mg via INTRAVENOUS
  Filled 2013-10-25 (×7): qty 1

## 2013-10-25 NOTE — Evaluation (Signed)
Occupational Therapy Evaluation Patient Details Name: Joe PernaByron Wanzer MRN: [id number] DOB: [birth year] Today's Date: 10/25/2013    History of Present Illness 80 y.o. male [facility] with swallowing foods, slurring of speech, memory loss, and neck stiffness x 2 weeks.  CT and MRI negative for stroke and pt suspected by neurology to have myesthenia gravis (testing is pending).  Pt started on IVIG 10/25/13.  Pt with no other significant PMHx listed in chart (per wife this is the first time in his life he has ever been in the hospital).     Clinical Impression   Pt s/p above. Education provided during session. Pt with increased neck flexion due to neck fatigue (pt reports it was worse when OT saw him compared to earlier in the day) during session-may benefit from soft collar and will check on pt tomorrow.     Follow Up Recommendations  No OT follow up;Supervision/Assistance - 24 hour    Equipment Recommendations  None recommended by OT    Recommendations for Other Services       Precautions / Restrictions Precautions Precautions: Other (comment) (swallowing)      Mobility Bed Mobility               General bed mobility comments: not assessed  Transfers Overall transfer level: Independent Equipment used: None                  Balance                                            ADL Overall ADL's : Needs assistance/impaired                     Lower Body Dressing: Supervision/safety;Sit to/from stand   Toilet Transfer: Supervision/safety;Ambulation (bed)       Tub/ Shower Transfer: Supervision/safety;Ambulation     General ADL Comments: Educated on safety tips (safe shoewear, sitting for LB ADLs) and wrote down for pt and also wrote down a few tips to assist with decreased short term memory.      Vision                     Perception     Praxis      Pertinent Vitals/Pain Pain Assessment: No/denies pain      Hand Dominance Right   Extremity/Trunk Assessment Upper Extremity Assessment Upper Extremity Assessment: Overall WFL for tasks assessed   Lower Extremity Assessment Lower Extremity Assessment: Defer to PT evaluation   Cervical / Trunk Assessment Cervical / Trunk Assessment: Other exceptions Cervical / Trunk Exceptions: h/o low back pain, Pt also with quick cervical extensor fatigue after being unsupported at trunk causing increased cervical flexion   Communication Communication Communication: Expressive difficulties;HOH (dysarthria)   Cognition Arousal/Alertness: Awake/alert Behavior During Therapy: WFL for tasks assessed/performed Overall Cognitive Status: History of cognitive impairments - at baseline Area of Impairment: Memory;Safety/judgement;Orientation Orientation Level: Disoriented to;Time   Memory: Decreased short-term memory   Safety/Judgement: Decreased awareness of safety         General Comments       Exercises       Shoulder Instructions      Home Living Family/patient expects to be discharged to:: Private residence Living Arrangements: Spouse/significant other Available Help at Discharge: Family;Available 24 hours/day Type of Home: House Home Access: Stairs to enter  Entrance Stairs-Number of Steps: 6 Entrance Stairs-Rails: Right Home Layout: One level     Bathroom Shower/Tub: Producer, television/film/videoWalk-in shower   Bathroom Toilet: Standard     Home Equipment: Environmental consultantWalker - 2 wheels;Cane - single point;Shower seat;Bedside commode;Wheelchair - manual          Prior Functioning/Environment Level of Independence: Independent        Comments: mows grass, walks up the hill in his backyard.  Loves the mountains, loved to hunt, but stopped.     OT Diagnosis: Generalized weakness   OT Problem List: Decreased strength;Decreased safety awareness;Decreased cognition   OT Treatment/Interventions: DME and/or AE instruction;Self-care/ADL training;Therapeutic  activities;Patient/family education    OT Goals(Current goals can be found in the care plan section) Acute Rehab OT Goals Patient Stated Goal: go home OT Goal Formulation: With patient Time For Goal Achievement: 11/01/13 Potential to Achieve Goals: Good ADL Goals Additional ADL Goal #1: Pt will tolerate use of soft collar to help hold head in upright position.  OT Frequency: Min 1X/week   Barriers to D/C:            Co-evaluation              End of Session Equipment Utilized During Treatment: Gait belt Nurse Communication: Other (comment) (mentioned soft collar to nurse)  Activity Tolerance: Patient tolerated treatment well Patient left: in bed;with call bell/phone within reach   Time: [id number] OT Time Calculation (min): 13 min Charges:  OT General Charges $OT Visit: 1 Procedure OT Evaluation $Initial OT Evaluation Tier I: 1 Procedure G-CodesEarlie Raveling:    Kiyonna Tortorelli L OTR/L [phone] 10/25/2013, 6:16 PM

## 2013-10-25 NOTE — Progress Notes (Signed)
Speech Language Pathology Treatment: Dysphagia  Patient Details Name: Joe PernaByron Fisher MRN: [id number] DOB: 1932/10/25 Today's Date: 10/25/2013 Time: 0930-1010 SLP Time Calculation (min): 40 min  Assessment / Plan / Recommendation Clinical Impression  Patient continues to have difficulty swallowing purees, bringing bolus back into the oral cavity from the pharynx (probably the valleculae) after 2-3 swallow attempts.  Pt swallows water with 2 swallows to clear, and no overt s/s of aspiration.  Pt's speech becomes more dysarthric the more he speaks.  He is unable to bite his teeth together, and continues to use his fist to manually elevate the mandible.  PO meds being given, may need to switch to IV or possibly Panda tube.  MBS scheduled for today at 1330.  Pt asks if he should be exercising his jaw and neck muscles for improved function.  SLP advised pt to rest, as it appears that exertion causes further decline.   HPI HPI: Joe Fisher is an 80 y.o. male who noted 3-4 weeks ago he was having a hard time keeping his head held in the upright position.  He states he is able to lift his head but after a while it will "droop forward".  Wife states he spends most of the day with his head flexed forward. Over the last 2 weeks wife has noted he has been having difficulty swallowing solid foods to the point she would chop all his meats up finely for fear he may choke. As patient will denise all symptoms asked about, most of further history was obtained from wife. She endorses that he has had periods of double vision which he states objects are vertically stacked in addition he has complained about his "jaw becoming tired the more he will eat food".  Both patient and wife denies any periods of SOB, weakness in legs or inability to climb stairs or do physical activity. Currently patient states he feels fine and desires to go home   Pertinent Vitals Pain Assessment: No/denies pain  SLP Plan  MBS    Recommendations  Diet recommendations: NPO Medication Administration: Via alternative means Postural Changes and/or Swallow Maneuvers: Seated upright 90 degrees              Oral Care Recommendations: Oral care Q4 per protocol Follow up Recommendations: 24 hour supervision/assistance Plan: MBS    GO     Maryjo RochesterWillis, Yetunde Leis T 10/25/2013, 10:17 AM

## 2013-10-25 NOTE — Progress Notes (Signed)
NIF -60  VC .90L 

## 2013-10-25 NOTE — Progress Notes (Signed)
CARE MANAGEMENT NOTE 10/25/2013  Patient:  Joe Fisher,Joe Fisher   Account Number:  [id number]  Date Initiated:  10/25/2013  Documentation initiated by:  Jiles CrockerHANDLER,Camren Lipsett  Subjective/Objective Assessment:   ADMITTED WITH Dysphagia,  Aphasia  and  Neck Pain     Action/Plan:   CM FOLLOWING FOR DCP   Anticipated DC Date:  10/27/2013   Anticipated DC Plan:  HOME/SELF CARE     DC Planning Services  CM consult          Status of service:  In process, will continue to follow Medicare Important Message given?   (If response is "NO", the following Medicare IM given date fields will be blank)  Per UR Regulation:  Reviewed for med. necessity/level of care/duration of stay  Comments:  10/13/2015Abelino Derrick- B Liba Hulsey RN,BSN,MHA [phone]

## 2013-10-25 NOTE — Progress Notes (Signed)
NIF -60 VC- 7.0L

## 2013-10-25 NOTE — Progress Notes (Addendum)
Patient ID: Joe PernaByron Gluth  male  ZOX:[id number]    DOB: 1932-08-11    DOA: 10/23/2013  PCP: Fonnie MuLITTLE, EDGAR W, MD    History of present illness Joe Fisher is a 80 y.o. male, with recent diagnosis of hyperlipidemia, with no other significant past medical history who presented to the ED with difficulty swallowing foods, slurring of speech, memory loss, and neck stiffness x 2 weeks. While in the ED patient also mentioned a moment of double vision, which is new to him. Today, he is having difficulty swallowing applesauce, resulting in him gagging per nurse documentation.  Neurology was consulted, suspected myasthenia gravis, MRI negative for acute CVA. Patient started on Mestinon however he is n.p.o. due to difficulty swallowing. Patient underwent repeat swallow study with MBS and did poorly. Will keep NPO with gentle hydration. Mestinon converted to IV.  Neuro to start IVIG.  Assessment/Plan:  Principal Problem: Progressively worsening dysphagia, facial muscle and neck muscle weakness  - likely secondary to myasthenia gravis (MRI brain negative for CVA)  - Acetylcholine receptor antibodies, and anti-striated antibodies ordered.  - Neurology consulted, recommended starting Mestinon, NCV/EMG outpatient - Patient is still n.p.o., ordered MBS today, change to Mestinon to IV dosing until diet order - d/w Dr Amada JupiterKirkpatrick, will start IVIG today   Active problems Hyperlipidemia  -Recent diagnosis and medication, hold Crestor while n.p.o.   DVT Prophylaxis:  Code Status:  Family Communication: Discussed with patient and his wife at the bedside  Disposition:  Consultants:  Neurology  Procedures:  MRI of the brain  Antibiotics:  None    Subjective: Patient seen and examined, feels better wants to go home, however he is still n.p.o. and unable to swallow. Wife at the bedside trying to convince him.  Objective: Weight change: -1.633 kg (-3 lb 9.6 oz)  Intake/Output Summary (Last  24 hours) at 10/25/13 1149 Last data filed at 10/25/13 0909  Gross per 24 hour  Intake      0 ml  Output      0 ml  Net      0 ml   Blood pressure 132/70, pulse 60, temperature 98.4 F (36.9 C), temperature source Oral, resp. rate 18, height 6\' 2"  (1.88 m), weight 80.922 kg (178 lb 6.4 oz), SpO2 98.00%.  Physical Exam: General: Alert and awake, oriented x3, not in any acute distress. CVS: S1-S2 clear, no murmur rubs or gallops Chest: clear to auscultation bilaterally, no wheezing, rales or rhonchi Abdomen: soft nontender, nondistended, normal bowel sounds  Extremities: no cyanosis, clubbing or edema noted bilaterally   Lab Results: Basic Metabolic Panel:  Recent Labs Lab 10/23/13 1844 10/24/13 1407  NA 138  --   K 3.8  --   CL 101  --   CO2 25  --   GLUCOSE 98  --   BUN 21  --   CREATININE 1.02 1.01  CALCIUM 9.3  --    Liver Function Tests:  Recent Labs Lab 10/24/13 1407  AST 40*  ALT 22  ALKPHOS 102  BILITOT 0.7  PROT 7.9  ALBUMIN 4.2   No results found for this basename: LIPASE, AMYLASE,  in the last 168 hours No results found for this basename: AMMONIA,  in the last 168 hours CBC:  Recent Labs Lab 10/23/13 1844 10/24/13 1407  WBC 6.6 9.1  HGB 15.3 16.3  HCT 43.7 47.0  MCV 86.5 86.7  PLT 210 236   Cardiac Enzymes: No results found for this basename: CKTOTAL, CKMB, CKMBINDEX,  TROPONINI,  in the last 168 hours BNP: No components found with this basename: POCBNP,  CBG: No results found for this basename: GLUCAP,  in the last 168 hours   Micro Results: No results found for this or any previous visit (from the past 240 hour(s)).  Studies/Results: Ct Head Wo Contrast  10/24/2013   CLINICAL DATA:  Difficulty swallowing for 1 week. Acute onset of slurred speech yesterday. Neck pain for 3 weeks. Initial encounter.  EXAM: CT HEAD WITHOUT CONTRAST  TECHNIQUE: Contiguous axial images were obtained from the base of the skull through the vertex without  intravenous contrast.  COMPARISON:  None.  FINDINGS: There is mild cerebral and cerebellar atrophy with diffuse sulcal prominence. Gray-white differentiation is grossly preserved. No CT evidence of acute large territory infarct. No intraparenchymal or extra-axial mass or hemorrhage. Normal size and configuration of the ventricles and basilar cisterns. No midline shift.  Limited visualization of the paranasal sinuses is normal. There is scattered opacification of the bilateral mastoid air cells, right greater than left.  There is slightly nodular asymmetric soft tissue thickening about the right parietal calvarium (image 21, series 2). This finding without associated radiopaque foreign body or displaced calvarial fracture.  IMPRESSION: 1. Mild atrophy without acute intracranial process. 2. Bilateral mastoid air cell effusions, right greater than left - nonspecific though could be seen in the setting of mastoiditis.   Electronically Signed   By: Simonne ComeJohn  Watts M.D.   On: 10/24/2013 00:39   Mr Brain Wo Contrast  10/24/2013   CLINICAL DATA:  Slurred speech. Dysphasia. Symptoms began 3 weeks ago. Difficulty swallowing. Speech worsening over the last 3 days.  EXAM: MRI HEAD WITHOUT CONTRAST  TECHNIQUE: Multiplanar, multiecho pulse sequences of the brain and surrounding structures were obtained without intravenous contrast.  COMPARISON:  Head CT ear earlier same day  FINDINGS: Diffusion imaging does not show any acute or subacute infarction. The brainstem is normal. No focal cerebellar insult. The cerebral hemispheres show mild age related atrophy with minimal small vessel change of the white matter, less than often seen in healthy individuals of this age. No cortical or large vessel territory infarction. No mass lesion, hemorrhage, hydrocephalus or extra-axial collection. There is an incidental and insignificant venous angioma at the right parietal vertex with a tiny amount of hemosiderin deposition. This should not be  of clinical relevance. No pituitary mass. No inflammatory sinus disease. There is fluid throughout the mastoid air cells bilaterally. This is more extensive on the right.  IMPRESSION: No acute brain pathology. Minimal small vessel change of the cerebral hemispheric white matter, less than often seen in healthy individuals of this age.  Incidental and likely insignificant venous angioma at the right parietal vertex with a small amount of hemosiderin deposition.  Fluid throughout the mastoid air cells bilaterally, right more than left.   Electronically Signed   By: Paulina FusiMark  Shogry M.D.   On: 10/24/2013 08:22    Medications: Scheduled Meds: . aspirin EC  81 mg Oral Daily  . enoxaparin (LOVENOX) injection  40 mg Subcutaneous Q24H  . pyridostigmine  2 mg Intravenous 3 times per day      LOS: 2 days   Pearlean Sabina M.D. [facility] 10/25/2013, 11:49 AM Pager: [phone]  If 7PM-7AM, please contact night-coverage [email or web address] Password [email or web address]

## 2013-10-25 NOTE — Evaluation (Signed)
Physical Therapy Evaluation Patient Details Name: Joe PernaByron Coco MRN: [id number] DOB: December 08, 1932 Today's Date: 10/25/2013   History of Present Illness  80 [facility]. [facility] with swallowing foods, slurring of speech, memory loss, and neck stiffness x 2 weeks.  CT and MRI negative for stroke and pt suspected by neurology to have myesthenia gravis (testing is pending).  Pt started on IVIG 10/25/13.  Pt with no other significant PMHx listed in chart (per wife this is the first time in his life he has ever been in the hospital).    Clinical Impression  Pt is mobilizing well with normal leg strength and baseline balance.  He does have significant muscle fatigue in his neck by the end of gait with increased neck flexion and decreased ability to extend his head and neck to keep it over his shoulders.  I don't want to do any aggressive neck strengthening exercises right now as it may over fatigue the neck and cause more issues.  If the neck does not return to normal with drug therapy, then he may benefit from OP PT, but will let f/u with neurology MD determine this.     Follow Up Recommendations No PT follow up    Equipment Recommendations  None recommended by PT    Recommendations for Other Services   NA    Precautions / Restrictions Precautions Precautions: Other (comment) (swallowing. ) Precaution Comments: pt on a restricted diet and going to have a swallowing test done this afternoon.       Mobility  Bed Mobility Overal bed mobility: Independent                Transfers Overall transfer level: Independent Equipment used: None                Ambulation/Gait Ambulation/Gait assistance: Supervision Ambulation Distance (Feet): 510 Feet Assistive device: None Gait Pattern/deviations: Staggering left;Staggering right     General Gait Details: very mildly staggering giat pattern, did not require assistance for any LOB.  Also, of note, increased neck  fatigue the further we walked the more flexe his neck became.  Once back in room pt positioned supine in the bed to rest his neck.   Stairs Stairs: Yes Stairs assistance: Modified independent (Device/Increase time) Stair Management: One rail Right;Alternating pattern;Forwards Number of Stairs: 5 General stair comments: pt did rely on rails for steadying assist on stairs, but reports this is normal for him at home.          Balance Overall balance assessment: Needs assistance Sitting-balance support: Feet supported;No upper extremity supported Sitting balance-Leahy Scale: Good     Standing balance support: No upper extremity supported Standing balance-Leahy Scale: Good                   Standardized Balance Assessment Standardized Balance Assessment : Dynamic Gait Index   Dynamic Gait Index Level Surface: Mild Impairment Change in Gait Speed: Normal Gait with Horizontal Head Turns: Normal Gait with Vertical Head Turns: Normal Gait and Pivot Turn: Normal Step Over Obstacle: Normal Step Around Obstacles: Normal Steps: Mild Impairment Total Score: 22       Pertinent Vitals/Pain Pain Assessment: No/denies pain    Home Living Family/patient expects to be discharged to:: Private residence Living Arrangements: Spouse/significant other Available Help at Discharge: Family;Available 24 hours/day Type of Home: House Home Access: Stairs to enter Entrance Stairs-Rails: Right Entrance Stairs-Number of Steps: 6 (garage entry) Home Layout: One level Home Equipment: Dan HumphreysWalker -  2 wheels;Cane - single point;Shower seat;Bedside commode;Wheelchair - manual      Prior Function Level of Independence: Independent         Comments: mows grass, walks up the hill in his backyard.  Loves the mountains, loved to hunt, but stopped.      Hand Dominance   Dominant Hand: Right    Extremity/Trunk Assessment   Upper Extremity Assessment: Defer to OT evaluation            Lower Extremity Assessment: Overall WFL for tasks assessed (5/5 MMT throughout)      Cervical / Trunk Assessment: Other exceptions  Communication   Communication: Expressive difficulties;HOH (dysarthria)  Cognition Arousal/Alertness: Awake/alert Behavior During Therapy: WFL for tasks assessed/performed Overall Cognitive Status: History of cognitive impairments - at baseline Area of Impairment: Problem solving     Memory: Decreased short-term memory       Problem Solving: Slow processing General Comments: Pt seems socially reliant on wife for memory.  He did answer my history questions correctly, but needed extra time to process the month and other basic quuestions (memory retreival vs truely slower processing for everything).              Assessment/Plan    PT Assessment Patent does not need any further PT services  PT Diagnosis Generalized weakness         PT Goals (Current goals can be found in the Care Plan section) Acute Rehab PT Goals Patient Stated Goal: to go home  PT Goal Formulation: No goals set, d/c therapy               End of Session Equipment Utilized During Treatment: Gait belt Activity Tolerance: Patient tolerated treatment well Patient left: in bed;with call bell/phone within reach;with family/visitor present Nurse Communication: Mobility status;Other (comment) (ok to walk in the room and hallway unassisted)         Time: [id number] PT Time Calculation (min): 28 min   Charges:   PT Evaluation $Initial PT Evaluation Tier I: 1 Procedure PT Treatments $Gait Training: 8-22 mins        Labarron Durnin B. Raysha Tilmon, PT, DPT [phone]   10/25/2013, 2:19 PM

## 2013-10-25 NOTE — Progress Notes (Signed)
Subjective: No significant changes overnight. He has ahd a rapidly progressive dysphagia over the past two weeks.   Of note, mother had MG  Exam: Filed Vitals:   10/25/13 1001  BP: 132/70  Pulse: 60  Temp: 98.4 F (36.9 C)  Resp: 18   Gen: In bed, NAD MS: awake, alert, oriented ZO:XWRUECN:PERRL, with sustained upgaze has right mild ptosis and slight dysconjugate gaze/diploplia. Palate moves well.  Motor: 5/5 throughout. Sensory:intact to LT  Impression: 80 yo M with progressive dysphagia and findings on exam very suggestive of myasthenia gravis. If a safe plan for eating can be obtained, then I would agree EMG prior to starting mestinon could be helpful, but if ST cannot come up with safe plan, then would go ahead and start mestinon.   Recommendations: 1) Will await ST evalaution.  2) further recs following ST.   Ritta SlotMcNeill Kirkpatrick, MD [facility] [phone]  If 7pm- 7am, please page neurology on call as listed in AMION.

## 2013-10-26 DIAGNOSIS — G7000 Myasthenia gravis without (acute) exacerbation: Principal | ICD-10-CM

## 2013-10-26 MED ORDER — PYRIDOSTIGMINE BROMIDE 10 MG/2ML IV SOLN
2.0000 mg | Freq: Three times a day (TID) | INTRAVENOUS | Status: DC
  Administered 2013-10-26 – 2013-10-28 (×8): 2 mg via INTRAVENOUS
  Filled 2013-10-26 (×11): qty 0.4

## 2013-10-26 NOTE — Progress Notes (Signed)
Speech Language Pathology Treatment: Dysphagia  Patient Details Name: Joe Fisher MRN: [id number] DOB: 03-15-1932 Today's Date: 10/26/2013 Time: 4098-11911512-1553 SLP Time Calculation (min): 41 min  Assessment / Plan / Recommendation Clinical Impression  Per chart review, pt was made NPO by MD due to difficulty swallowing during lunch meal. Pt today with complaints of increased difficulty keeping his head in an upright position, using his hand to manually lift his head during intake. Pt consumed Dys 1 textures and thin liquids with Mod cues from SLP for use of swallowing strategies recommended during MBS on previous date. Pt with continued c/o residue, intermittent wet vocal quality, and "hocking" cough that ultimately results in oral expectoration of pureed foods. Pt likely spit out the majority of applesauce he consumed during limited trials. Suspect that pt's swallowing musculature is more fatigued today than during MBS yesterday given the above and his subjective reports. Recommend to continue NPO with continued PO trials at bedside while pt continues to undergo treatment.    HPI HPI: Joe Fisher is an 80 y.o. male who noted 3-4 weeks ago he was having a hard time keeping his head held in the upright position.  He states he is able to lift his head but after a while it will "droop forward".  Wife states he spends most of the day with his head flexed forward. Over the last 2 weeks wife has noted he has been having difficulty swallowing solid foods to the point she would chop all his meats up finely for fear he may choke. As patient will denise all symptoms asked about, most of further history was obtained from wife. She endorses that he has had periods of double vision which he states objects are vertically stacked in addition he has complained about his "jaw becoming tired the more he will eat food".  Both patient and wife denies any periods of SOB, weakness in legs or inability to climb stairs or do  physical activity. Currently patient states he feels fine and desires to go home   Pertinent Vitals Pain Assessment: No/denies pain  SLP Plan  Continue with current plan of care    Recommendations Diet recommendations: NPO Medication Administration: Via alternative means              Oral Care Recommendations: Oral care Q4 per protocol Follow up Recommendations: 24 hour supervision/assistance;Home health SLP Plan: Continue with current plan of care    GO      Joe Fisher, M.A. CCC-SLP [phone]  Joe Hamaiewonsky, Joe Fisher 10/26/2013, 4:08 PM

## 2013-10-26 NOTE — Progress Notes (Signed)
Orthopedic Tech Progress Note Patient Details:  Kerin PernaByron Kolker 12-23-1932 [id number]  Ortho Devices Type of Ortho Device: Soft collar Ortho Device/Splint Interventions: Application   Cammer, Mickie BailJennifer Carol 10/26/2013, 9:29 AM

## 2013-10-26 NOTE — Progress Notes (Addendum)
Patient ID: Kerin PernaByron Defreitas  male  ZOX:[id number]    DOB: 02/12/1932    DOA: 10/23/2013  PCP: Fonnie MuLITTLE, EDGAR W, MD    History of present illness Kerin PernaByron Pratte is a 80 y.o. male, with recent diagnosis of hyperlipidemia, with no other significant past medical history who presented to the ED with difficulty swallowing foods, slurring of speech, memory loss, and neck stiffness x 2 weeks. While in the ED patient also mentioned a moment of double vision, which is new to him. Today, he is having difficulty swallowing applesauce, resulting in him gagging per nurse documentation.  Neurology was consulted, suspected myasthenia gravis, MRI negative for acute CVA. Patient started on Mestinon however he is n.p.o. due to difficulty swallowing. Patient underwent repeat swallow study with MBS and did poorly. Will keep NPO with gentle hydration. Mestinon converted to IV.  Neuro to start IVIG.  Assessment/Plan:  Principal Problem: Progressively worsening dysphagia, facial muscle and neck muscle weakness  - likely secondary to myasthenia gravis (MRI brain negative for CVA)  - Acetylcholine receptor antibodies, and anti-striated antibodies ordered.  - Neurology following - D/w Dr Amada JupiterKirkpatrick, made him NPO today given difficulties with swallowing -Was started IVIG 10/25/2013   Active problems Hyperlipidemia  -Recent diagnosis and medication, hold Crestor while n.p.o.   DVT Prophylaxis:  Code Status:  Family Communication: Discussed with patient and his wife at the bedside  Disposition:  Consultants:  Neurology  Procedures:  MRI of the brain  Antibiotics:  None    Subjective: Patient seen and examined, continues to have issues with swallowing, spoke with neurology made patient NPO  Objective: Weight change:   Intake/Output Summary (Last 24 hours) at 10/26/13 1741 Last data filed at 10/26/13 0900  Gross per 24 hour  Intake    240 ml  Output    400 ml  Net   -160 ml   Blood pressure  113/68, pulse 63, temperature 97.4 F (36.3 C), temperature source Oral, resp. rate 18, height 6\' 2"  (1.88 m), weight 80.922 kg (178 lb 6.4 oz), SpO2 96.00%.  Physical Exam: General: Alert and awake, oriented x3, not in any acute distress. CVS: S1-S2 clear, no murmur rubs or gallops Chest: clear to auscultation bilaterally, no wheezing, rales or rhonchi Abdomen: soft nontender, nondistended, normal bowel sounds  Extremities: no cyanosis, clubbing or edema noted bilaterally   Lab Results: Basic Metabolic Panel:  Recent Labs Lab 10/23/13 1844 10/24/13 1407  NA 138  --   K 3.8  --   CL 101  --   CO2 25  --   GLUCOSE 98  --   BUN 21  --   CREATININE 1.02 1.01  CALCIUM 9.3  --    Liver Function Tests:  Recent Labs Lab 10/24/13 1407  AST 40*  ALT 22  ALKPHOS 102  BILITOT 0.7  PROT 7.9  ALBUMIN 4.2   No results found for this basename: LIPASE, AMYLASE,  in the last 168 hours No results found for this basename: AMMONIA,  in the last 168 hours CBC:  Recent Labs Lab 10/23/13 1844 10/24/13 1407  WBC 6.6 9.1  HGB 15.3 16.3  HCT 43.7 47.0  MCV 86.5 86.7  PLT 210 236   Cardiac Enzymes: No results found for this basename: CKTOTAL, CKMB, CKMBINDEX, TROPONINI,  in the last 168 hours BNP: No components found with this basename: POCBNP,  CBG: No results found for this basename: GLUCAP,  in the last 168 hours   Micro Results: No results found  for this or any previous visit (from the past 240 hour(s)).  Studies/Results: Ct Head Wo Contrast  10/24/2013   CLINICAL DATA:  Difficulty swallowing for 1 week. Acute onset of slurred speech yesterday. Neck pain for 3 weeks. Initial encounter.  EXAM: CT HEAD WITHOUT CONTRAST  TECHNIQUE: Contiguous axial images were obtained from the base of the skull through the vertex without intravenous contrast.  COMPARISON:  None.  FINDINGS: There is mild cerebral and cerebellar atrophy with diffuse sulcal prominence. Gray-white  differentiation is grossly preserved. No CT evidence of acute large territory infarct. No intraparenchymal or extra-axial mass or hemorrhage. Normal size and configuration of the ventricles and basilar cisterns. No midline shift.  Limited visualization of the paranasal sinuses is normal. There is scattered opacification of the bilateral mastoid air cells, right greater than left.  There is slightly nodular asymmetric soft tissue thickening about the right parietal calvarium (image 21, series 2). This finding without associated radiopaque foreign body or displaced calvarial fracture.  IMPRESSION: 1. Mild atrophy without acute intracranial process. 2. Bilateral mastoid air cell effusions, right greater than left - nonspecific though could be seen in the setting of mastoiditis.   Electronically Signed   By: Simonne ComeJohn  Watts M.D.   On: 10/24/2013 00:39   Mr Brain Wo Contrast  10/24/2013   CLINICAL DATA:  Slurred speech. Dysphasia. Symptoms began 3 weeks ago. Difficulty swallowing. Speech worsening over the last 3 days.  EXAM: MRI HEAD WITHOUT CONTRAST  TECHNIQUE: Multiplanar, multiecho pulse sequences of the brain and surrounding structures were obtained without intravenous contrast.  COMPARISON:  Head CT ear earlier same day  FINDINGS: Diffusion imaging does not show any acute or subacute infarction. The brainstem is normal. No focal cerebellar insult. The cerebral hemispheres show mild age related atrophy with minimal small vessel change of the white matter, less than often seen in healthy individuals of this age. No cortical or large vessel territory infarction. No mass lesion, hemorrhage, hydrocephalus or extra-axial collection. There is an incidental and insignificant venous angioma at the right parietal vertex with a tiny amount of hemosiderin deposition. This should not be of clinical relevance. No pituitary mass. No inflammatory sinus disease. There is fluid throughout the mastoid air cells bilaterally. This is  more extensive on the right.  IMPRESSION: No acute brain pathology. Minimal small vessel change of the cerebral hemispheric white matter, less than often seen in healthy individuals of this age.  Incidental and likely insignificant venous angioma at the right parietal vertex with a small amount of hemosiderin deposition.  Fluid throughout the mastoid air cells bilaterally, right more than left.   Electronically Signed   By: Paulina FusiMark  Shogry M.D.   On: 10/24/2013 08:22    Medications: Scheduled Meds: . aspirin EC  81 mg Oral Daily  . diphenhydrAMINE  12.5 mg Intravenous Q24H  . enoxaparin (LOVENOX) injection  40 mg Subcutaneous Q24H  . IMMUNE GLOBULIN 10% (HUMAN) IV - For Fluid Restriction Only  400 mg/kg Intravenous Q24H  . pyridostigmine  2 mg Intravenous TID AC & HS   Time Spent: 25 min   LOS: 3 days   Jeralyn BennettZAMORA, Myeesha Shane M.D. [facility] 10/26/2013, 5:41 PM Pager: [phone]  If 7PM-7AM, please contact night-coverage [email or web address] Password [email or web address]

## 2013-10-26 NOTE — Progress Notes (Signed)
NIF -50 VC 9.3 L

## 2013-10-26 NOTE — Progress Notes (Signed)
Occupational Therapy Treatment Patient Details Name: Joe PernaByron Fisher MRN: [id number] DOB: 1932/11/26 Today's Date: 10/26/2013    History of present illness 80 y.o. male [facility] with swallowing foods, slurring of speech, memory loss, and neck stiffness x 2 weeks.  CT and MRI negative for stroke and pt suspected by neurology to have myesthenia gravis (testing is pending).  Pt started on IVIG 10/25/13.  Pt with no other significant PMHx listed in chart (per wife this is the first time in his life he has ever been in the hospital).     OT comments  Pt seen today for pt/family education and vision screening. Provided extensive education to pt and family re: myasthenia gravis (possible dx), symptoms, treatment, and need to rest neck and facial muscles. Discussed benefit of rest, however pt memory made recall of information difficult. Provided visual cue to remind pt to rest and encouraged wife to remind pt to rest.    Follow Up Recommendations  No OT follow up;Supervision/Assistance - 24 hour    Equipment Recommendations  None recommended by OT    Recommendations for Other Services      Precautions / Restrictions Precautions Precautions: Other (comment) (swallowing) Precaution Comments: pt returned to NPO following concerns from MD with pt's ability to manage lunch. SLP completed bedside swallow with poor results and has recommended NPO (10/26/13).       Mobility Bed Mobility Overal bed mobility: Independent                Transfers Overall transfer level: Independent                        ADL                                         General ADL Comments: Pt continues to require education due to neck fatigue and neck flexion when OOB. Educated pt on role of back/neck muscles in holding head upright and the effect of myasthenia gravis on muscles (fatigue) resulting in forward head drop. Encouraged pt to recline in chair or bed for  remainder of evening and allow neck muscles time to recover. Pt wearing soft neck collar, however continues to have forward head drop. Unfortunately, pt's memory makes it difficult for him to recall the importance of rest for his muscles and feel that he will likely get OOB again tonight despite several times discussing him staying in bed in reclined position. Provided pt/wife with education materials on myasthenia gravis.        Vision Eye Alignment: Within Functional Limits Alignment/Gaze Preference: Within Defined Limits Ocular Range of Motion: Within Functional Limits Tracking/Visual Pursuits: Able to track stimulus in all quads without difficulty   Convergence: Within functional limits Diplopia Assessment: Other (comment) (see comments)   Additional Comments: Pt reports diplopia when he stares at an object close up for a long time. Wonder if ocular muscles are also fatiguing due to myasthenia gravis. Convergence tested and [facility] to [facility].           Cognition  Arousal/Alertness: Awake/Alert Behavior During Therapy: WFL for tasks assessed/performed Overall Cognitive Status: History of cognitive impairments - at baseline Area of Impairment: Memory;Safety/judgement;Orientation     Memory: Decreased short-term memory    Safety/Judgement: Decreased awareness of safety   Problem Solving: Slow processing General Comments: Pt with difficulty understanding dx  and muscle fatigue explaination despite multiple attempts by OT. Pt with poor memory and asks the same questions several minutes apart.                  Pertinent Vitals/ Pain       Pain Assessment: No/denies pain         Frequency Min 1X/week     Progress Toward Goals  OT Goals(current goals can now be found in the care plan section)  Progress towards OT goals: Progressing toward goals  Acute Rehab OT Goals Patient Stated Goal: go home OT Goal Formulation: With patient Time For Goal Achievement:  11/01/13 Potential to Achieve Goals: Good ADL Goals Additional ADL Goal #1: Pt will tolerate use of soft collar to help hold head in upright position.  Plan Discharge plan remains appropriate       End of Session    Activity Tolerance Patient tolerated treatment well   Patient Left in bed;with call bell/phone within reach;with family/visitor present   Nurse Communication          Time: [id number] OT Time Calculation (min): 33 min  Charges: OT General Charges $OT Visit: 1 Procedure OT Treatments $Self Care/Home Management : 23-37 mins  Rae LipsMiller, Deshonda Cryderman M 10/26/2013, 5:24 PM  Carney LivingLeeAnn Marie Lillee Mooneyhan, OTR/L Occupational Therapist [phone] (pager)

## 2013-10-26 NOTE — Progress Notes (Addendum)
Subjective: Contineus to have difficulty with swallowing. On my entering the room, he was trying to clear his pharynx with great difficulty(eg clearing his throat and trying to spit it up).   Exam: Filed Vitals:   10/26/13 1016  BP: 141/57  Pulse: 81  Temp: 97.9 F (36.6 C)  Resp: 18   Gen: In bed, NAD MS: awake, alert, oriented [id number]:[id number], with sustained upgaze has right mild ptosis and slight dysconjugate gaze/diploplia. Palate moves well.  Motor: 5/5 throughout. Sensory:intact to LT  Ach-R - pending  Impression: 80 yo M with progressive dysphagia and findings on exam very suggestive of myasthenia gravis. I contineu to be concerned about his swallowing. Given how affected he is and the clinical concern for MG, he has been started on IVIG. He does feel improvemetn with mestinon.   Recommendations: 1) Increase mestinon to QID dosing.  2) will make NPO for now.  3) IVIG day 2/5 4) will continue to follow.  5) telemetry while receiving IV mestinon  Ritta SlotMcNeill Rebeckah Masih, MD [facility] [phone]  If 7pm- 7am, please page neurology on call as listed in AMION.

## 2013-10-27 LAB — CBC
HEMATOCRIT: 40 % (ref 39.0–52.0)
Hemoglobin: 13.6 g/dL (ref 13.0–17.0)
MCH: 29.8 pg (ref 26.0–34.0)
MCHC: 34 g/dL (ref 30.0–36.0)
MCV: 87.5 fL (ref 78.0–100.0)
Platelets: 150 10*3/uL (ref 150–400)
RBC: 4.57 MIL/uL (ref 4.22–5.81)
RDW: 13.7 % (ref 11.5–15.5)
WBC: 3.5 10*3/uL — AB (ref 4.0–10.5)

## 2013-10-27 LAB — STRIATED MUSCLE ANTIBODY

## 2013-10-27 LAB — BASIC METABOLIC PANEL
Anion gap: 9 (ref 5–15)
BUN: 13 mg/dL (ref 6–23)
CALCIUM: 8.6 mg/dL (ref 8.4–10.5)
CO2: 27 mEq/L (ref 19–32)
Chloride: 107 mEq/L (ref 96–112)
Creatinine, Ser: 0.99 mg/dL (ref 0.50–1.35)
GFR calc Af Amer: 88 mL/min — ABNORMAL LOW (ref >90)
GFR calc non Af Amer: 76 mL/min — ABNORMAL LOW (ref >90)
GLUCOSE: 92 mg/dL (ref 70–99)
Potassium: 3.7 mEq/L (ref 3.7–5.3)
Sodium: 143 mEq/L (ref 137–147)

## 2013-10-27 MED ORDER — METHYLPREDNISOLONE SODIUM SUCC 125 MG IJ SOLR
60.0000 mg | Freq: Every day | INTRAMUSCULAR | Status: DC
  Administered 2013-10-27 – 2013-10-29 (×3): 60 mg via INTRAVENOUS
  Filled 2013-10-27 (×3): qty 2

## 2013-10-27 MED ORDER — PANTOPRAZOLE SODIUM 40 MG IV SOLR
40.0000 mg | INTRAVENOUS | Status: DC
  Administered 2013-10-27 – 2013-10-28 (×2): 40 mg via INTRAVENOUS
  Filled 2013-10-27 (×2): qty 40

## 2013-10-27 NOTE — Progress Notes (Addendum)
Great effort with: NIF >-60 VC 9.1 L

## 2013-10-27 NOTE — Progress Notes (Signed)
Great effort with NIF and VC with results of NIF >-60, VC 3.84 L

## 2013-10-27 NOTE — Progress Notes (Addendum)
Subjective: Wife has noticed some improvement in eyelid droop.   Exam: Filed Vitals:   10/27/13 1005  BP: 120/67  Pulse: 52  Temp: 97.5 F (36.4 C)  Resp: 18   Gen: In bed, NAD MS: awake, alert, oriented [id number]:[id number]:PERRL, with sustained upgaze has right mild ptosis improved from previous days, no diploplia. Palate moves well.  Motor: 5/5 throughout. Sensory:intact to LT  Ach-R - pending  Impression: 80 yo M with progressive dysphagia and findings on exam very suggestive of myasthenia gravis. I contineu to be concerned about his swallowing. Given how affected he is(unable to swallow) and the clinical concern for MG, he has been started on IVIG. He does feel improvement with mestinon which would also support a dx of MG.   Recommendations: 1) continue mestinon 2mg  IV QID 2) Reevaluatino by ST, would perform 1 hour after mestinon dose. Also would bring meals 30 minutes - 1 hour after mestinon dose.  3) IVIG day 3/5 4) solumedrol 60mg  IV daily, change to prednisone once able.  5) telemetry while receiving IV mestinon 6) PPI while on steroids.   Ritta SlotMcNeill Konstantine Gervasi, MD [facility] [phone]  If 7pm- 7am, please page neurology on call as listed in AMION.

## 2013-10-27 NOTE — Progress Notes (Signed)
Patient ID: Joe Fisher Julian  male  [id number]    DOB: Apr 28, 1932    DOA: 10/23/2013  PCP: Fonnie MuLITTLE, EDGAR W, MD    History of present illness Joe Fisher is a 80 y.o. male, with recent diagnosis of hyperlipidemia, with no other significant past medical history who presented to the ED with difficulty swallowing foods, slurring of speech, memory loss, and neck stiffness x 2 weeks. While in the ED patient also mentioned a moment of double vision, which is new to him. Today, he is having difficulty swallowing applesauce, resulting in him gagging per nurse documentation.  Neurology was consulted, suspected myasthenia gravis, MRI negative for acute CVA. Patient started on Mestinon however he is n.p.o. due to difficulty swallowing. Patient underwent repeat swallow study with MBS and did poorly. Will keep NPO with gentle hydration. Mestinon converted to IV.  Neuro to start IVIG.  Assessment/Plan:  Principal Problem: Progressively worsening dysphagia, facial muscle and neck muscle weakness  - likely secondary to myasthenia gravis (MRI brain negative for CVA)  - Acetylcholine receptor antibodies, and anti-striated antibodies ordered.  - Neurology following - D/w Dr Amada JupiterKirkpatrick, made him NPO today given difficulties with swallowing -Was started IVIG 10/25/2013. Also been treated with Mestinon and SoluMedrol  -Patient having some improvement today, his diet was advanced dysphagia 1   Active problems Hyperlipidemia  -Recent diagnosis and medication, hold Crestor while n.p.o.   DVT Prophylaxis:  Code Status:  Family Communication: Discussed with patient and his wife at the bedside  Disposition:  Consultants:  Neurology  Procedures:  MRI of the brain  Antibiotics:  None    Subjective: Patient seen and examined, he thinks he may be feeling better. There are no overnight event  Objective: Weight change:   Intake/Output Summary (Last 24 hours) at 10/27/13 1715 Last data filed  at 10/27/13 0830  Gross per 24 hour  Intake    120 ml  Output      0 ml  Net    120 ml   Blood pressure 113/66, pulse 70, temperature 97.3 F (36.3 C), temperature source Oral, resp. rate 20, height 6\' 2"  (1.88 m), weight 80.922 kg (178 lb 6.4 oz), SpO2 98.00%.  Physical Exam: General: Alert and awake, oriented x3, not in any acute distress. CVS: S1-S2 clear, no murmur rubs or gallops Chest: clear to auscultation bilaterally, no wheezing, rales or rhonchi Abdomen: soft nontender, nondistended, normal bowel sounds  Extremities: no cyanosis, clubbing or edema noted bilaterally   Lab Results: Basic Metabolic Panel:  Recent Labs Lab 10/23/13 1844 10/24/13 1407 10/27/13 0500  NA 138  --  143  K 3.8  --  3.7  CL 101  --  107  CO2 25  --  27  GLUCOSE 98  --  92  BUN 21  --  13  CREATININE 1.02 1.01 0.99  CALCIUM 9.3  --  8.6   Liver Function Tests:  Recent Labs Lab 10/24/13 1407  AST 40*  ALT 22  ALKPHOS 102  BILITOT 0.7  PROT 7.9  ALBUMIN 4.2   No results found for this basename: LIPASE, AMYLASE,  in the last 168 hours No results found for this basename: AMMONIA,  in the last 168 hours CBC:  Recent Labs Lab 10/24/13 1407 10/27/13 0500  WBC 9.1 3.5*  HGB 16.3 13.6  HCT 47.0 40.0  MCV 86.7 87.5  PLT 236 150   Cardiac Enzymes: No results found for this basename: CKTOTAL, CKMB, CKMBINDEX, TROPONINI,  in the last  168 hours BNP: No components found with this basename: POCBNP,  CBG: No results found for this basename: GLUCAP,  in the last 168 hours   Micro Results: No results found for this or any previous visit (from the past 240 hour(s)).  Studies/Results: Ct Head Wo Contrast  10/24/2013   CLINICAL DATA:  Difficulty swallowing for 1 week. Acute onset of slurred speech yesterday. Neck pain for 3 weeks. Initial encounter.  EXAM: CT HEAD WITHOUT CONTRAST  TECHNIQUE: Contiguous axial images were obtained from the base of the skull through the vertex without  intravenous contrast.  COMPARISON:  None.  FINDINGS: There is mild cerebral and cerebellar atrophy with diffuse sulcal prominence. Gray-white differentiation is grossly preserved. No CT evidence of acute large territory infarct. No intraparenchymal or extra-axial mass or hemorrhage. Normal size and configuration of the ventricles and basilar cisterns. No midline shift.  Limited visualization of the paranasal sinuses is normal. There is scattered opacification of the bilateral mastoid air cells, right greater than left.  There is slightly nodular asymmetric soft tissue thickening about the right parietal calvarium (image 21, series 2). This finding without associated radiopaque foreign body or displaced calvarial fracture.  IMPRESSION: 1. Mild atrophy without acute intracranial process. 2. Bilateral mastoid air cell effusions, right greater than left - nonspecific though could be seen in the setting of mastoiditis.   Electronically Signed   By: Simonne ComeJohn  Watts M.D.   On: 10/24/2013 00:39   Mr Brain Wo Contrast  10/24/2013   CLINICAL DATA:  Slurred speech. Dysphasia. Symptoms began 3 weeks ago. Difficulty swallowing. Speech worsening over the last 3 days.  EXAM: MRI HEAD WITHOUT CONTRAST  TECHNIQUE: Multiplanar, multiecho pulse sequences of the brain and surrounding structures were obtained without intravenous contrast.  COMPARISON:  Head CT ear earlier same day  FINDINGS: Diffusion imaging does not show any acute or subacute infarction. The brainstem is normal. No focal cerebellar insult. The cerebral hemispheres show mild age related atrophy with minimal small vessel change of the white matter, less than often seen in healthy individuals of this age. No cortical or large vessel territory infarction. No mass lesion, hemorrhage, hydrocephalus or extra-axial collection. There is an incidental and insignificant venous angioma at the right parietal vertex with a tiny amount of hemosiderin deposition. This should not be  of clinical relevance. No pituitary mass. No inflammatory sinus disease. There is fluid throughout the mastoid air cells bilaterally. This is more extensive on the right.  IMPRESSION: No acute brain pathology. Minimal small vessel change of the cerebral hemispheric white matter, less than often seen in healthy individuals of this age.  Incidental and likely insignificant venous angioma at the right parietal vertex with a small amount of hemosiderin deposition.  Fluid throughout the mastoid air cells bilaterally, right more than left.   Electronically Signed   By: Paulina FusiMark  Shogry M.D.   On: 10/24/2013 08:22    Medications: Scheduled Meds: . aspirin EC  81 mg Oral Daily  . diphenhydrAMINE  12.5 mg Intravenous Q24H  . enoxaparin (LOVENOX) injection  40 mg Subcutaneous Q24H  . IMMUNE GLOBULIN 10% (HUMAN) IV - For Fluid Restriction Only  400 mg/kg Intravenous Q24H  . methylPREDNISolone (SOLU-MEDROL) injection  60 mg Intravenous Daily  . pantoprazole (PROTONIX) IV  40 mg Intravenous Q24H  . pyridostigmine  2 mg Intravenous TID AC & HS   Time Spent: 25 min   LOS: 4 days   Jeralyn BennettZAMORA, Lisette Mancebo M.D. [facility] 10/27/2013, 5:15 PM Pager: [phone]  If 7PM-7AM, please contact night-coverage [email or web address] Password [email or web address]

## 2013-10-27 NOTE — Progress Notes (Signed)
Speech Language Pathology Treatment: Dysphagia  Patient Details Name: Kerin PernaByron Dimartino MRN: [id number] DOB: 1932/05/12 Today's Date: 10/27/2013 Time: 1400-1430 SLP Time Calculation (min): 30 min  Assessment / Plan / Recommendation Clinical Impression  Pt with noticeable improvements with clarity and volume of speech and head control, as he was able to hold his head upright without using his hands. Subjectively he says he feels improved from yesterday. SLP provided Min cues for use of swallowing strategies with Dys 1 textures and thin liquids. Pt's vocal quality remained clear throughout intake, with no coughing observed. Intermittent throat clearing and multiple swallows were observed, suspect due to sensation of residuals. Pt reported feeling decreased residual in his throat today, which he believed "went down" with sips of water. Pt only orally expectorated a very small amount of applesauce x1 across all trials.  Overall pt appears improved from yesterday, and would recommend restarting Dys 1 diet and thin liquids with compensatory strategies recommended during MBS (sip of water after each bite, extra swallows, small bites/sips, frequent rest breaks). Would recommend to coordinate meals and medications to allow for optimal function during intake. SLP to follow closely for tolerance.   HPI HPI: Kerin PernaByron Socarras is an 80 y.o. male who noted 3-4 weeks ago he was having a hard time keeping his head held in the upright position.  He states he is able to lift his head but after a while it will "droop forward".  Wife states he spends most of the day with his head flexed forward. Over the last 2 weeks wife has noted he has been having difficulty swallowing solid foods to the point she would chop all his meats up finely for fear he may choke. As patient will denise all symptoms asked about, most of further history was obtained from wife. She endorses that he has had periods of double vision which he states objects are  vertically stacked in addition he has complained about his "jaw becoming tired the more he will eat food".  Both patient and wife denies any periods of SOB, weakness in legs or inability to climb stairs or do physical activity. Currently patient states he feels fine and desires to go home   Pertinent Vitals Pain Assessment: No/denies pain  SLP Plan  Goals updated    Recommendations Diet recommendations: Dysphagia 1 (puree);Thin liquid Liquids provided via: Cup Medication Administration: Crushed with puree (or via alternative means) Supervision: Patient able to self feed;Full supervision/cueing for compensatory strategies Compensations: Slow rate;Small sips/bites;Follow solids with liquid;Multiple dry swallows after each bite/sip (frequent rest breaks) Postural Changes and/or Swallow Maneuvers: Seated upright 90 degrees;Upright 30-60 min after meal              Oral Care Recommendations: Oral care Q4 per protocol Follow up Recommendations: 24 hour supervision/assistance;Home health SLP Plan: Goals updated    GO     Maxcine HamLaura Paiewonsky, M.A. CCC-SLP [phone]  Maxcine Hamaiewonsky, Mirian Casco 10/27/2013, 2:33 PM

## 2013-10-28 LAB — ACETYLCHOLINE RECEPTOR, BINDING: Acetylcholine Receptor Ab: 36.86 nmol/L — ABNORMAL HIGH (ref <=0.30)

## 2013-10-28 MED ORDER — TEMAZEPAM 7.5 MG PO CAPS: 15.0000 mg | ORAL_CAPSULE | Freq: Every evening | ORAL | Status: DC | PRN

## 2013-10-28 MED ORDER — PYRIDOSTIGMINE BROMIDE 60 MG/5ML PO SYRP
60.0000 mg | ORAL_SOLUTION | Freq: Three times a day (TID) | ORAL | Status: DC
  Administered 2013-10-28 – 2013-10-30 (×7): 60 mg via ORAL
  Filled 2013-10-28 (×12): qty 5

## 2013-10-28 NOTE — Progress Notes (Signed)
Subjective: Diet advanced  Exam: Filed Vitals:   10/28/13 1000  BP: 137/67  Pulse: 66  Temp: 97.3 F (36.3 C)  Resp: 16   Gen: In bed, NAD MS: awake, alert, oriented ZO:XWRUECN:PERRL, with sustained upgaze has right mild ptosis improved from previous days, no diploplia. Palate moves well.  Motor: 5/5 throughout. Sensory:intact to LT  Ach-R - pending  Impression: 80 yo M with progressive dysphagia and findings on exam very suggestive of myasthenia gravis. I contineu to be concerned about his swallowing. Given how affected he is(unable to swallow) and the clinical concern for MG, he has been started on IVIG. He does feel improvement with mestinon which would also support a dx of MG.   Recommendations: 1) change to mestinon syrup 60mg  QID 2) ST 3) IVIG day 4/5 4) solumedrol 60mg  IV daily, change to prednisone once able.  5) PPI while on steroids.   Ritta SlotMcNeill Mikeisha Lemonds, MD [facility] [phone]  If 7pm- 7am, please page neurology on call as listed in AMION.

## 2013-10-28 NOTE — Progress Notes (Signed)
Patient ID: Joe PernaByron Gidley  male  MWN:[id number]    DOB: [birth year]    DOA: 10/23/2013  PCP: Fonnie MuLITTLE, EDGAR W, MD    History of present illness Joe Fisher is a 80 y.o. male, with recent diagnosis of hyperlipidemia, with no other significant past medical history who presented to the ED with difficulty swallowing foods, slurring of speech, memory loss, and neck stiffness x 2 weeks. While in the ED patient also mentioned a moment of double vision, which is new to him. Today, he is having difficulty swallowing applesauce, resulting in him gagging per nurse documentation.  Neurology was consulted, suspected myasthenia gravis, MRI negative for acute CVA. Patient started on Mestinon however he is n.p.o. due to difficulty swallowing. Patient underwent repeat swallow study with MBS and did poorly. Will keep NPO with gentle hydration. Mestinon converted to IV.  Neuro to start IVIG.  Assessment/Plan:  Principal Problem: Progressively worsening dysphagia, facial muscle and neck muscle weakness  - likely secondary to myasthenia gravis (MRI brain negative for CVA)  - Acetylcholine receptor antibodies, and anti-striated antibodies ordered. Lab reporting anti-striated antibodies <1:40 - Neurology following -Was started IVIG 10/25/2013. Also been treated with Mestinon 2 mg IV TID and hs and SoluMedrol 60 mg IV q daily -Patient having some improvement yesterday for which his diet was advanced dysphagia 1.   DVT Prophylaxis: Lovenox  Code Status: Full Code  Family Communication: Discussed with patient and his wife at the bedside  Disposition: Plan for 5 days of IVIG  Consultants:  Neurology  Procedures:  MRI of the brain  Subjective: Patient seen and examined, he thinks he may be feeling better. There are no overnight event  Objective: Weight change:   Intake/Output Summary (Last 24 hours) at 10/28/13 1534 Last data filed at 10/27/13 1758  Gross per 24 hour  Intake    240 ml  Output       0 ml  Net    240 ml   Blood pressure 137/67, pulse 66, temperature 97.3 F (36.3 C), temperature source Oral, resp. rate 16, height 6\' 2"  (1.88 m), weight 80.922 kg (178 lb 6.4 oz), SpO2 100.00%.  Physical Exam: General: Alert and awake, oriented x3, not in any acute distress. CVS: S1-S2 clear, no murmur rubs or gallops Chest: clear to auscultation bilaterally, no wheezing, rales or rhonchi Abdomen: soft nontender, nondistended, normal bowel sounds  Extremities: no cyanosis, clubbing or edema noted bilaterally   Lab Results: Basic Metabolic Panel:  Recent Labs Lab 10/23/13 1844 10/24/13 1407 10/27/13 0500  NA 138  --  143  K 3.8  --  3.7  CL 101  --  107  CO2 25  --  27  GLUCOSE 98  --  92  BUN 21  --  13  CREATININE 1.02 1.01 0.99  CALCIUM 9.3  --  8.6   Liver Function Tests:  Recent Labs Lab 10/24/13 1407  AST 40*  ALT 22  ALKPHOS 102  BILITOT 0.7  PROT 7.9  ALBUMIN 4.2   No results found for this basename: LIPASE, AMYLASE,  in the last 168 hours No results found for this basename: AMMONIA,  in the last 168 hours CBC:  Recent Labs Lab 10/24/13 1407 10/27/13 0500  WBC 9.1 3.5*  HGB 16.3 13.6  HCT 47.0 40.0  MCV 86.7 87.5  PLT 236 150   Cardiac Enzymes: No results found for this basename: CKTOTAL, CKMB, CKMBINDEX, TROPONINI,  in the last 168 hours BNP: No components found with  this basename: POCBNP,  CBG: No results found for this basename: GLUCAP,  in the last 168 hours   Micro Results: No results found for this or any previous visit (from the past 240 hour(s)).  Studies/Results: Ct Head Wo Contrast  10/24/2013   CLINICAL DATA:  Difficulty swallowing for 1 week. Acute onset of slurred speech yesterday. Neck pain for 3 weeks. Initial encounter.  EXAM: CT HEAD WITHOUT CONTRAST  TECHNIQUE: Contiguous axial images were obtained from the base of the skull through the vertex without intravenous contrast.  COMPARISON:  None.  FINDINGS: There is mild  cerebral and cerebellar atrophy with diffuse sulcal prominence. Gray-white differentiation is grossly preserved. No CT evidence of acute large territory infarct. No intraparenchymal or extra-axial mass or hemorrhage. Normal size and configuration of the ventricles and basilar cisterns. No midline shift.  Limited visualization of the paranasal sinuses is normal. There is scattered opacification of the bilateral mastoid air cells, right greater than left.  There is slightly nodular asymmetric soft tissue thickening about the right parietal calvarium (image 21, series 2). This finding without associated radiopaque foreign body or displaced calvarial fracture.  IMPRESSION: 1. Mild atrophy without acute intracranial process. 2. Bilateral mastoid air cell effusions, right greater than left - nonspecific though could be seen in the setting of mastoiditis.   Electronically Signed   By: Simonne ComeJohn  Watts M.D.   On: 10/24/2013 00:39   Mr Brain Wo Contrast  10/24/2013   CLINICAL DATA:  Slurred speech. Dysphasia. Symptoms began 3 weeks ago. Difficulty swallowing. Speech worsening over the last 3 days.  EXAM: MRI HEAD WITHOUT CONTRAST  TECHNIQUE: Multiplanar, multiecho pulse sequences of the brain and surrounding structures were obtained without intravenous contrast.  COMPARISON:  Head CT ear earlier same day  FINDINGS: Diffusion imaging does not show any acute or subacute infarction. The brainstem is normal. No focal cerebellar insult. The cerebral hemispheres show mild age related atrophy with minimal small vessel change of the white matter, less than often seen in healthy individuals of this age. No cortical or large vessel territory infarction. No mass lesion, hemorrhage, hydrocephalus or extra-axial collection. There is an incidental and insignificant venous angioma at the right parietal vertex with a tiny amount of hemosiderin deposition. This should not be of clinical relevance. No pituitary mass. No inflammatory sinus  disease. There is fluid throughout the mastoid air cells bilaterally. This is more extensive on the right.  IMPRESSION: No acute brain pathology. Minimal small vessel change of the cerebral hemispheric white matter, less than often seen in healthy individuals of this age.  Incidental and likely insignificant venous angioma at the right parietal vertex with a small amount of hemosiderin deposition.  Fluid throughout the mastoid air cells bilaterally, right more than left.   Electronically Signed   By: Paulina FusiMark  Shogry M.D.   On: 10/24/2013 08:22    Medications: Scheduled Meds: . aspirin EC  81 mg Oral Daily  . diphenhydrAMINE  12.5 mg Intravenous Q24H  . enoxaparin (LOVENOX) injection  40 mg Subcutaneous Q24H  . IMMUNE GLOBULIN 10% (HUMAN) IV - For Fluid Restriction Only  400 mg/kg Intravenous Q24H  . methylPREDNISolone (SOLU-MEDROL) injection  60 mg Intravenous Daily  . pantoprazole (PROTONIX) IV  40 mg Intravenous Q24H  . pyridostigmine  2 mg Intravenous TID AC & HS   Time Spent: 25 min   LOS: 5 days   Jeralyn BennettZAMORA, Priscille Shadduck M.D. [facility] 10/28/2013, 3:34 PM Pager: [phone]  If 7PM-7AM, please contact night-coverage [email or web address] Password  [email or web address]

## 2013-10-28 NOTE — Progress Notes (Signed)
FVC 1.9L NIF -36

## 2013-10-28 NOTE — Progress Notes (Signed)
Speech Language Pathology Treatment: Dysphagia  Patient Details Name: Joe Fisher MRN: [id number] DOB: April 16, 1932 Today's Date: 10/28/2013 Time: 0750-0810 SLP Time Calculation (min): 20 min  Assessment / Plan / Recommendation Clinical Impression  Pt demonstrates decreased function today, he is quite discouraged and anxious, pacing the room, manually lifting his head with his hand. He feels he is "back to zero." SLP observed that with small sips of thin liquids he has immediate strong throat clearing and multiple weak swallows which he reports is much worse than how he was feeling at dinner yesterday. He reports lack of sleep and a problem with receiving his medication, which are likely contributing to muscle fatigue and decreased strength with swallowing. SLP discussed with MD and RN who plan to problem solve for better rest for pt if possible. For now, pt is aware of decline and inability to consume breakfast and so he will plan to wait until lunch time to attempt meals. SLP will continue to follow for swallowing treatment.    HPI HPI: Joe Fisher is an 80 y.o. male who noted 3-4 weeks ago he was having a hard time keeping his head held in the upright position.  He states he is able to lift his head but after a while it will "droop forward".  Wife states he spends most of the day with his head flexed forward. Over the last 2 weeks wife has noted he has been having difficulty swallowing solid foods to the point she would chop all his meats up finely for fear he may choke. As patient will denise all symptoms asked about, most of further history was obtained from wife. She endorses that he has had periods of double vision which he states objects are vertically stacked in addition he has complained about his "jaw becoming tired the more he will eat food".  Both patient and wife denies any periods of SOB, weakness in legs or inability to climb stairs or do physical activity. Currently patient states he  feels fine and desires to go home   Pertinent Vitals Pain Assessment: No/denies pain  SLP Plan  Continue with current plan of care    Recommendations Diet recommendations: Dysphagia 1 (puree);Thin liquid Liquids provided via: Cup Medication Administration: Crushed with puree Supervision: Patient able to self feed;Full supervision/cueing for compensatory strategies Compensations: Slow rate;Small sips/bites;Follow solids with liquid;Multiple dry swallows after each bite/sip Postural Changes and/or Swallow Maneuvers: Seated upright 90 degrees;Upright 30-60 min after meal              Oral Care Recommendations: Oral care Q4 per protocol Follow up Recommendations: 24 hour supervision/assistance;Home health SLP Plan: Continue with current plan of care    GO    [facility] Hospital & Medical CenterBonnie Caison Hearn, MA CCC-SLP [phone]  Claudine MoutonDeBlois, Pearly Bartosik Caroline 10/28/2013, 8:19 AM

## 2013-10-28 NOTE — Progress Notes (Signed)
UR COMPLETED  

## 2013-10-28 NOTE — Progress Notes (Signed)
Chaplain visited pt after referral from pt nurse. Chaplain explored with pt and family themes of loss, coping strategies, and provided emotional support. Pt wife told chaplain about loss of two of three children, her own illnesses, and now her husband who is normally healthy is ill. Pt wife had many questions about God's role in the difficult parts of her life. Pt and family were appreciative of chaplain's presence and willingness to listen to their stories. Chaplain will follow as needed.   10/28/13 1500  Clinical Encounter Type  Visited With Patient and family together  Visit Type Initial;Spiritual support  Spiritual Encounters  Spiritual Needs Emotional  Stress Factors  Patient Stress Factors Health changes  Family Stress Factors Family relationships;Loss;Health changes;Major life changes;Loss of control  Carnell Casamento, Mayer MaskerCourtney F, Chaplain 10/28/2013 3:07 PM

## 2013-10-29 LAB — CBC
HEMATOCRIT: 37.3 % — AB (ref 39.0–52.0)
HEMOGLOBIN: 12.8 g/dL — AB (ref 13.0–17.0)
MCH: 29.9 pg (ref 26.0–34.0)
MCHC: 34.3 g/dL (ref 30.0–36.0)
MCV: 87.1 fL (ref 78.0–100.0)
Platelets: 161 10*3/uL (ref 150–400)
RBC: 4.28 MIL/uL (ref 4.22–5.81)
RDW: 13.6 % (ref 11.5–15.5)
WBC: 4.8 10*3/uL (ref 4.0–10.5)

## 2013-10-29 LAB — BASIC METABOLIC PANEL
Anion gap: 9 (ref 5–15)
BUN: 17 mg/dL (ref 6–23)
CHLORIDE: 105 meq/L (ref 96–112)
CO2: 25 mEq/L (ref 19–32)
CREATININE: 1.06 mg/dL (ref 0.50–1.35)
Calcium: 8.6 mg/dL (ref 8.4–10.5)
GFR calc non Af Amer: 65 mL/min — ABNORMAL LOW (ref >90)
GFR, EST AFRICAN AMERICAN: 75 mL/min — AB (ref >90)
GLUCOSE: 86 mg/dL (ref 70–99)
Potassium: 4 mEq/L (ref 3.7–5.3)
Sodium: 139 mEq/L (ref 137–147)

## 2013-10-29 MED ORDER — PREDNISONE 20 MG PO TABS
60.0000 mg | ORAL_TABLET | Freq: Every day | ORAL | Status: DC
  Administered 2013-10-30: 60 mg via ORAL
  Filled 2013-10-29: qty 3

## 2013-10-29 NOTE — Progress Notes (Signed)
Subjective: Doing well with current diet.   Exam: Filed Vitals:   10/29/13 1337  BP: 134/62  Pulse: 55  Temp: 97.4 F (36.3 C)  Resp: 20   Gen: In bed, NAD MS: awake, alert, oriented ZO:XWRUECN:PERRL, with sustained upgaze has right mild ptosis improved from previous days, no diploplia. Palate moves well.  Motor: 5/5 throughout. Sensory:intact to LT  Ach-R - pending  Impression: 80 yo M with progressive dysphagia and findings on exam very suggestive of myasthenia gravis. I contineu to be concerned about his swallowing. Given how affected he is(unable to swallow) and the clinical concern for MG, he has been started on IVIG. He does feel improvement with mestinon which would also support a dx of MG. He has had clear improvement since starting IVIG.   Recommendations: 1) continue mestinon syrup 60mg  QID.  2) ST 3) IVIG day 5/5 4) change solumedrol to prednisone 60mg  daily.  5) PPI while on steroids.   Ritta SlotMcNeill Ruel Dimmick, MD [facility] [phone]  If 7pm- 7am, please page neurology on call as listed in AMION.

## 2013-10-29 NOTE — Progress Notes (Signed)
IVIG ended at 2015. VS done at 0845. Will continue to monitor pt. Salvadore OxfordJessica Belenda Alviar, RN 10/29/2013 [phone]

## 2013-10-29 NOTE — Progress Notes (Signed)
Patient ID: Joe Fisher  male  ONG:[id number]    DOB: June 22, 1932    DOA: 10/23/2013  PCP: Fonnie MuLITTLE, EDGAR W, MD    History of present illness Joe PernaByron Korzeniewski is a 80 y.o. male, with recent diagnosis of hyperlipidemia, with no other significant past medical history who presented to the ED with difficulty swallowing foods, slurring of speech, memory loss, and neck stiffness x 2 weeks. While in the ED patient also mentioned a moment of double vision, which is new to him. Today, he is having difficulty swallowing applesauce, resulting in him gagging per nurse documentation.  Neurology was consulted, suspected myasthenia gravis, MRI negative for acute CVA. Patient started on Mestinon however he is n.p.o. due to difficulty swallowing. Patient underwent repeat swallow study with MBS and did poorly. Will keep NPO with gentle hydration. Mestinon converted to IV.  Neuro to start IVIG.  Assessment/Plan:  Principal Problem: Progressively worsening dysphagia, facial muscle and neck muscle weakness  - likely secondary to myasthenia gravis (MRI brain negative for CVA)  - Acetylcholine receptor antibodies, and anti-striated antibodies ordered. Lab reporting anti-striated antibodies <1:40 - Neurology following -Was started IVIG 10/25/2013. Also been treated with Mestinon 2 mg IV TID and hs and SoluMedrol 60 mg IV q daily -Significantly improved today, he did well with supper and breakfast -Case discussed with Neurology, plan to transition him to oral Prednisone. Also change Mestinon to 60 mg PO QID, meanwhile will have his 5th dose of IVIG this evening.  DVT Prophylaxis: Lovenox  Code Status: Full Code  Family Communication: Discussed with patient and his wife at the bedside  Disposition: Plan for 5 days of IVIG, possibly could go home tomorrow.   Consultants:  Neurology  Procedures:  MRI of the brain  Subjective: Patient seen and examined, he thinks he may be feeling better. There are no  overnight events  Objective: Weight change:   Intake/Output Summary (Last 24 hours) at 10/29/13 1516 Last data filed at 10/29/13 1300  Gross per 24 hour  Intake    720 ml  Output      0 ml  Net    720 ml   Blood pressure 134/62, pulse 55, temperature 97.4 F (36.3 C), temperature source Oral, resp. rate 20, height 6\' 2"  (1.88 m), weight 80.922 kg (178 lb 6.4 oz), SpO2 98.00%.  Physical Exam: General: Alert and awake, oriented x3, not in any acute distress. CVS: S1-S2 clear, no murmur rubs or gallops Chest: clear to auscultation bilaterally, no wheezing, rales or rhonchi Abdomen: soft nontender, nondistended, normal bowel sounds  Extremities: no cyanosis, clubbing or edema noted bilaterally   Lab Results: Basic Metabolic Panel:  Recent Labs Lab 10/27/13 0500 10/29/13 0423  NA 143 139  K 3.7 4.0  CL 107 105  CO2 27 25  GLUCOSE 92 86  BUN 13 17  CREATININE 0.99 1.06  CALCIUM 8.6 8.6   Liver Function Tests:  Recent Labs Lab 10/24/13 1407  AST 40*  ALT 22  ALKPHOS 102  BILITOT 0.7  PROT 7.9  ALBUMIN 4.2   No results found for this basename: LIPASE, AMYLASE,  in the last 168 hours No results found for this basename: AMMONIA,  in the last 168 hours CBC:  Recent Labs Lab 10/27/13 0500 10/29/13 0423  WBC 3.5* 4.8  HGB 13.6 12.8*  HCT 40.0 37.3*  MCV 87.5 87.1  PLT 150 161   Cardiac Enzymes: No results found for this basename: CKTOTAL, CKMB, CKMBINDEX, TROPONINI,  in the last  168 hours BNP: No components found with this basename: POCBNP,  CBG: No results found for this basename: GLUCAP,  in the last 168 hours   Micro Results: No results found for this or any previous visit (from the past 240 hour(s)).  Studies/Results: Ct Head Wo Contrast  10/24/2013   CLINICAL DATA:  Difficulty swallowing for 1 week. Acute onset of slurred speech yesterday. Neck pain for 3 weeks. Initial encounter.  EXAM: CT HEAD WITHOUT CONTRAST  TECHNIQUE: Contiguous axial  images were obtained from the base of the skull through the vertex without intravenous contrast.  COMPARISON:  None.  FINDINGS: There is mild cerebral and cerebellar atrophy with diffuse sulcal prominence. Gray-white differentiation is grossly preserved. No CT evidence of acute large territory infarct. No intraparenchymal or extra-axial mass or hemorrhage. Normal size and configuration of the ventricles and basilar cisterns. No midline shift.  Limited visualization of the paranasal sinuses is normal. There is scattered opacification of the bilateral mastoid air cells, right greater than left.  There is slightly nodular asymmetric soft tissue thickening about the right parietal calvarium (image 21, series 2). This finding without associated radiopaque foreign body or displaced calvarial fracture.  IMPRESSION: 1. Mild atrophy without acute intracranial process. 2. Bilateral mastoid air cell effusions, right greater than left - nonspecific though could be seen in the setting of mastoiditis.   Electronically Signed   By: Simonne ComeJohn  Watts M.D.   On: 10/24/2013 00:39   Mr Brain Wo Contrast  10/24/2013   CLINICAL DATA:  Slurred speech. Dysphasia. Symptoms began 3 weeks ago. Difficulty swallowing. Speech worsening over the last 3 days.  EXAM: MRI HEAD WITHOUT CONTRAST  TECHNIQUE: Multiplanar, multiecho pulse sequences of the brain and surrounding structures were obtained without intravenous contrast.  COMPARISON:  Head CT ear earlier same day  FINDINGS: Diffusion imaging does not show any acute or subacute infarction. The brainstem is normal. No focal cerebellar insult. The cerebral hemispheres show mild age related atrophy with minimal small vessel change of the white matter, less than often seen in healthy individuals of this age. No cortical or large vessel territory infarction. No mass lesion, hemorrhage, hydrocephalus or extra-axial collection. There is an incidental and insignificant venous angioma at the right  parietal vertex with a tiny amount of hemosiderin deposition. This should not be of clinical relevance. No pituitary mass. No inflammatory sinus disease. There is fluid throughout the mastoid air cells bilaterally. This is more extensive on the right.  IMPRESSION: No acute brain pathology. Minimal small vessel change of the cerebral hemispheric white matter, less than often seen in healthy individuals of this age.  Incidental and likely insignificant venous angioma at the right parietal vertex with a small amount of hemosiderin deposition.  Fluid throughout the mastoid air cells bilaterally, right more than left.   Electronically Signed   By: Paulina FusiMark  Shogry M.D.   On: 10/24/2013 08:22    Medications: Scheduled Meds: . aspirin EC  81 mg Oral Daily  . diphenhydrAMINE  12.5 mg Intravenous Q24H  . enoxaparin (LOVENOX) injection  40 mg Subcutaneous Q24H  . IMMUNE GLOBULIN 10% (HUMAN) IV - For Fluid Restriction Only  400 mg/kg Intravenous Q24H  . methylPREDNISolone (SOLU-MEDROL) injection  60 mg Intravenous Daily  . pantoprazole (PROTONIX) IV  40 mg Intravenous Q24H  . pyridostigmine  60 mg Oral TID AC & HS   Time Spent: 20 min   LOS: 6 days   Jeralyn BennettZAMORA, Caera Enwright M.D. [facility] 10/29/2013, 3:16 PM Pager: [phone]  If 7PM-7AM, please contact night-coverage [email or web address] Password [email or web address]

## 2013-10-29 NOTE — Progress Notes (Signed)
RT Note: NIF -55, VC 3.2, good pt effort noted, RT to monitor.

## 2013-10-30 MED ORDER — PREDNISONE 20 MG PO TABS: 60.0000 mg | ORAL_TABLET | Freq: Every day | ORAL | Status: DC

## 2013-10-30 MED ORDER — PYRIDOSTIGMINE BROMIDE 60 MG/5ML PO SYRP: 60.0000 mg | ORAL_SOLUTION | Freq: Three times a day (TID) | ORAL | Status: DC

## 2013-10-30 MED ORDER — PANTOPRAZOLE SODIUM 40 MG PO TBEC
40.0000 mg | DELAYED_RELEASE_TABLET | Freq: Every day | ORAL | Status: DC
  Filled 2013-10-30: qty 1

## 2013-10-30 MED ORDER — ASPIRIN 81 MG PO TBEC: 81.0000 mg | DELAYED_RELEASE_TABLET | Freq: Every day | ORAL | Status: DC

## 2013-10-30 MED ORDER — OMEPRAZOLE 40 MG PO CPDR: 40.0000 mg | DELAYED_RELEASE_CAPSULE | Freq: Every day | ORAL | Status: DC

## 2013-10-30 NOTE — Progress Notes (Signed)
Speech Language Pathology Treatment: Dysphagia  Patient Details Name: Joe Fisher MRN: [id number] DOB: 1932-11-27 Today's Date: 10/30/2013 Time: 1320-1400 SLP Time Calculation (min): 40 min  Assessment / Plan / Recommendation Clinical Impression  Pt seen prior to d/c SLP reinforced strategies and precautions including awareness of fatigue, moistening foods, swallowing several time and utilizing liquid wash. Provided written and verbal instruction and list of foods to attempt and to avoid on a mechanical soft diet. Pt and family grateful for information. Pt declined further PO as he had just finished a regular texture meal which was very successful per pt and family. SLP goal met at this time, will sign off as d/c home is imminent.    HPI HPI: Joe Fisher is an 80 y.o. male who noted 3-4 weeks ago he was having a hard time keeping his head held in the upright position.  He states he is able to lift his head but after a while it will "droop forward".  Wife states he spends most of the day with his head flexed forward. Over the last 2 weeks wife has noted he has been having difficulty swallowing solid foods to the point she would chop all his meats up finely for fear he may choke. As patient will denise all symptoms asked about, most of further history was obtained from wife. She endorses that he has had periods of double vision which he states objects are vertically stacked in addition he has complained about his "jaw becoming tired the more he will eat food".  Both patient and wife denies any periods of SOB, weakness in legs or inability to climb stairs or do physical activity. Currently patient states he feels fine and desires to go home   Pertinent Vitals    SLP Plan  All goals met    Recommendations Diet recommendations: Dysphagia 3 (mechanical soft);Thin liquid Liquids provided via: Cup Medication Administration: Whole meds with puree Supervision: Patient able to self feed;Full  supervision/cueing for compensatory strategies Compensations: Slow rate;Small sips/bites;Follow solids with liquid;Multiple dry swallows after each bite/sip Postural Changes and/or Swallow Maneuvers: Seated upright 90 degrees;Upright 30-60 min after meal              Plan: All goals met    GO    Joe Baltimore, MA CCC-SLP [phone]  Joe Fisher 10/30/2013, 1:41 PM

## 2013-10-30 NOTE — Progress Notes (Signed)
NIF -60 and VC 4.2L  Good effort

## 2013-10-30 NOTE — Progress Notes (Signed)
Late Entry: Around 2345 pt's wife stated that she did not want vital signs to be done in the "middle of the night" and asked that we refrain from "coming into the room all of the time". RN explained reasoning for q4hr vital signs and q2 rounding at night. Pt's wife stated she was fine with RN checking on pt throughout night, but did not want tech coming into room to take vital signs during the night. RN will continue to be to monitor pt. Will take 6am vital signs an hour early. Salvadore OxfordJessica Tangee Marszalek, RN 10/30/2013 [phone]

## 2013-10-30 NOTE — Progress Notes (Signed)
Subjective: Doing well with current diet.   Exam: Filed Vitals:   10/30/13 1031  BP: 135/68  Pulse: 58  Temp: 97.5 F (36.4 C)  Resp: 18   Gen: In bed, NAD MS: awake, alert, oriented ZO:XWRUECN:PERRL, with sustained upgaze has right mild ptosis improved from previous days, no diploplia. Palate moves well.  Motor: 5/5 throughout. Sensory:intact to LT  Ach-R - positive  Impression: 80 yo M with progressive dysphagia and findings on exam very suggestive of myasthenia gravis. I contineu to be concerned about his swallowing. Given how affected he is(unable to swallow) and the clinical concern for MG, he has been started on IVIG. He does feel improvement with mestinon which would also support a dx of MG. He has had clear improvement since starting IVIG.   Recommendations: 1) continue mestinon syrup 60mg  QID.  2) ST 3) continue prednisone 60mg  daily.  4) PPI while on steroids.  5) he will need follow up with outpatient neurologist.   Ritta SlotMcNeill Aby Gessel, MD [facility] [phone]  If 7pm- 7am, please page neurology on call as listed in AMION.

## 2013-10-30 NOTE — Discharge Summary (Signed)
Physician Discharge Summary  Joe Fisher XBJ:[id number] DOB: Nov 28, 1932 DOA: 10/23/2013  PCP: Fonnie MuLITTLE, EDGAR W, MD  Admit date: 10/23/2013 Discharge date: 10/30/2013  Time spent: 35 minutes  Recommendations for Outpatient Follow-up:  1. Patient treated for myasthenia gravis, discharged on prednisone 60 mg by mouth daily and Mestinon60 mg 4 times daily. Was instructed to follow-up with [facility] in 1-2 weeks  Discharge Diagnoses:  Principal Problem:   Myasthenia gravis Active Problems:   Dysphagia   Hyperlipidemia   Discharge Condition: Stable/improved  Diet recommendation: Heart healthy  Filed Weights   10/23/13 1840 10/24/13 1430  Weight: 82.555 kg (182 lb) 80.922 kg (178 lb 6.4 oz)    History of present illness:  Joe Fisher is a 80 y.o. male, with recent diagnosis of hyperlipidemia, with no other significant past medical history who presented to the ED with difficulty swallowing foods, slurring of speech, memory loss, and neck stiffness x 2 weeks. While in the ED patient also mentioned a moment of double vision, which is new to him. Today, he is having difficulty swallowing applesauce, resulting in him gagging per nurse documentation. He denies problems with swallowing applesauce, however his wife reports this has worsened. Wife also reports that his head drops to his chest most of the time. He is able to move his head without difficulty, but does complain of neck stiffness at times. Workup in the ED, including MRI and CT of the head revealed no stroke.    Hospital Course:  Joe Fisher is a 80 y.o. male, with recent diagnosis of hyperlipidemia, with no other significant past medical history who presented to the ED with difficulty swallowing foods, slurring of speech, memory loss, and neck stiffness x 2 weeks. While in the ED patient also mentioned a moment of double vision, which is new to him. Today, he is having difficulty swallowing applesauce, resulting  in him gagging per nurse documentation. Neurology was consulted, suspected myasthenia gravis, MRI negative for acute CVA. Patient started on Mestinon however he is n.p.o. due to difficulty swallowing. Patient underwent repeat swallow study with MBS and did poorly. Will keep NPO with gentle hydration. Mestinon converted to IV as he was treated with a five-day course of IVIG. Patient was also treated with IV Solu-Medrol. He showed gradual clinical improvement as his diet was slowly advanced. By 10/30/2013 he reported feeling significantly better as he was tolerating by mouth intake. Neurology recommending discharging him on  Mestinon 60 mg by mouth 4 times a day and prednisone 60 mg by mouth daily. [facility]2 weeks.   Consultations:  Neurology  Discharge Exam: Filed Vitals:   10/30/13 0524  BP: 114/57  Pulse: 56  Temp: 97.9 F (36.6 C)  Resp: 18    General: Alert and awake, oriented x3, not in any acute distress.  CVS: S1-S2 clear, no murmur rubs or gallops  Chest: clear to auscultation bilaterally, no wheezing, rales or rhonchi  Abdomen: soft nontender, nondistended, normal bowel sounds  Extremities: no cyanosis, clubbing or edema noted bilaterally   Discharge Instructions You were cared for by a hospitalist during your hospital stay. If you have any questions about your discharge medications or the care you received while you were in the hospital after you are discharged, you can call the unit and asked to speak with the hospitalist on call if the hospitalist that took care of you is not available. Once you are discharged, your primary care physician will handle any  further medical issues. Please note that NO REFILLS for any discharge medications will be authorized once you are discharged, as it is imperative that you return to your primary care physician (or establish a relationship with a primary care physician if you do not have one)  for your aftercare needs so that they can reassess your need for medications and monitor your lab values.  Discharge Instructions   Call MD for:  difficulty breathing, headache or visual disturbances    Complete by:  As directed      Call MD for:  extreme fatigue    Complete by:  As directed      Call MD for:  hives    Complete by:  As directed      Call MD for:  persistant dizziness or light-headedness    Complete by:  As directed      Call MD for:  persistant nausea and vomiting    Complete by:  As directed      Call MD for:  redness, tenderness, or signs of infection (pain, swelling, redness, odor or green/yellow discharge around incision site)    Complete by:  As directed      Call MD for:  severe uncontrolled pain    Complete by:  As directed      Call MD for:  temperature >100.4    Complete by:  As directed      Diet - low sodium heart healthy    Complete by:  As directed      Increase activity slowly    Complete by:  As directed           Current Discharge Medication List    START taking these medications   Details  aspirin EC 81 MG EC tablet Take 1 tablet (81 mg total) by mouth daily. Qty: 30 tablet, Refills: 1    omeprazole (PRILOSEC) 40 MG capsule Take 1 capsule (40 mg total) by mouth daily. Qty: 30 capsule, Refills: 1    predniSONE (DELTASONE) 20 MG tablet Take 3 tablets (60 mg total) by mouth daily with breakfast. Qty: 90 tablet, Refills: 0    pyridostigmine (MESTINON) 60 MG/5ML syrup Take 5 mLs (60 mg total) by mouth 4 (four) times daily -  before meals and at bedtime. Qty: 473 mL, Refills: 12      CONTINUE these medications which have NOT CHANGED   Details  CALCIUM PO Take 1 tablet by mouth daily.    rosuvastatin (CRESTOR) 10 MG tablet Take 10 mg by mouth daily.       No Known Allergies Follow-up Information   Follow up with [facility] In 1 week.   Contact information:   [address] [place]  [address] [phone]      Follow up with [facility] In 1 week.   Specialty:  Gastroenterology   Contact information:   [address] [place] [address] [phone]      Follow up with Levert Feinstein, MD On 10/26/2013. (be at office at 0815 hours for EMG.)    Specialty:  Neurology   Contact information:   [address] [place] [address] [phone]       Follow up with LITTLE, Murrell Redden, MD In 1 week.   Specialty:  Pediatrics   Contact information:   [address] [place] [address] [phone]        The results of significant diagnostics from this hospitalization (including imaging, microbiology, ancillary and  laboratory) are listed below for reference.    Significant Diagnostic Studies: Ct Head Wo Contrast  10/24/2013   CLINICAL DATA:  Difficulty swallowing for 1 week. Acute onset of slurred speech yesterday. Neck pain for 3 weeks. Initial encounter.  EXAM: CT HEAD WITHOUT CONTRAST  TECHNIQUE: Contiguous axial images were obtained from the base of the skull through the vertex without intravenous contrast.  COMPARISON:  None.  FINDINGS: There is mild cerebral and cerebellar atrophy with diffuse sulcal prominence. Gray-white differentiation is grossly preserved. No CT evidence of acute large territory infarct. No intraparenchymal or extra-axial mass or hemorrhage. Normal size and configuration of the ventricles and basilar cisterns. No midline shift.  Limited visualization of the paranasal sinuses is normal. There is scattered opacification of the bilateral mastoid air cells, right greater than left.  There is slightly nodular asymmetric soft tissue thickening about the right parietal calvarium (image 21, series 2). This finding without associated radiopaque foreign body or displaced calvarial fracture.  IMPRESSION: 1. Mild atrophy without acute intracranial process. 2. Bilateral mastoid air cell effusions, right greater than left -  nonspecific though could be seen in the setting of mastoiditis.   Electronically Signed   By: Simonne ComeJohn  Watts M.D.   On: 10/24/2013 00:39   Mr Brain Wo Contrast  10/24/2013   CLINICAL DATA:  Slurred speech. Dysphasia. Symptoms began 3 weeks ago. Difficulty swallowing. Speech worsening over the last 3 days.  EXAM: MRI HEAD WITHOUT CONTRAST  TECHNIQUE: Multiplanar, multiecho pulse sequences of the brain and surrounding structures were obtained without intravenous contrast.  COMPARISON:  Head CT ear earlier same day  FINDINGS: Diffusion imaging does not show any acute or subacute infarction. The brainstem is normal. No focal cerebellar insult. The cerebral hemispheres show mild age related atrophy with minimal small vessel change of the white matter, less than often seen in healthy individuals of this age. No cortical or large vessel territory infarction. No mass lesion, hemorrhage, hydrocephalus or extra-axial collection. There is an incidental and insignificant venous angioma at the right parietal vertex with a tiny amount of hemosiderin deposition. This should not be of clinical relevance. No pituitary mass. No inflammatory sinus disease. There is fluid throughout the mastoid air cells bilaterally. This is more extensive on the right.  IMPRESSION: No acute brain pathology. Minimal small vessel change of the cerebral hemispheric white matter, less than often seen in healthy individuals of this age.  Incidental and likely insignificant venous angioma at the right parietal vertex with a small amount of hemosiderin deposition.  Fluid throughout the mastoid air cells bilaterally, right more than left.   Electronically Signed   By: Paulina FusiMark  Shogry M.D.   On: 10/24/2013 08:22    Microbiology: No results found for this or any previous visit (from the past 240 hour(s)).   Labs: Basic Metabolic Panel:  Recent Labs Lab 10/23/13 1844 10/24/13 1407 10/27/13 0500 10/29/13 0423  NA 138  --  143 139  K 3.8  --  3.7 4.0   CL 101  --  107 105  CO2 25  --  27 25  GLUCOSE 98  --  92 86  BUN 21  --  13 17  CREATININE 1.02 1.01 0.99 1.06  CALCIUM 9.3  --  8.6 8.6   Liver Function Tests:  Recent Labs Lab 10/24/13 1407  AST 40*  ALT 22  ALKPHOS 102  BILITOT 0.7  PROT 7.9  ALBUMIN 4.2   No results found for this basename: LIPASE, AMYLASE,  in the last 168 hours No results found for this basename: AMMONIA,  in the last 168 hours CBC:  Recent Labs Lab 10/23/13 1844 10/24/13 1407 10/27/13 0500 10/29/13 0423  WBC 6.6 9.1 3.5* 4.8  HGB 15.3 16.3 13.6 12.8*  HCT 43.7 47.0 40.0 37.3*  MCV 86.5 86.7 87.5 87.1  PLT 210 236 150 161   Cardiac Enzymes: No results found for this basename: CKTOTAL, CKMB, CKMBINDEX, TROPONINI,  in the last 168 hours BNP: BNP (last 3 results) No results found for this basename: PROBNP,  in the last 8760 hours CBG: No results found for this basename: GLUCAP,  in the last 168 hours     Signed:  Jeralyn Bennett  [facility] 10/30/2013, 9:28 AM

## 2013-10-30 NOTE — Progress Notes (Signed)
Neurology requested that patient be seen by speech therapist for dietary recommendations for meals at home.  Wants foods that will minimize risk for aspiration.  Contacted speech therapist who stated that she was seeing a lot of NPO patients but that she would get to patient this afternoon.  Doctor made aware of time delay; was okay with delay.  Family informed of the same.

## 2013-11-01 ENCOUNTER — Telehealth: Payer: Self-pay | Admitting: Gastroenterology

## 2013-11-01 NOTE — Telephone Encounter (Signed)
Patient was admitted with Myasthenia Gravis and dysphagia associated with it.  She was told to make an appt for her husband for follow up.  [facility] was not involved in the care in the hospital.  I spoke with his wife and asked if there was another reason he was being referred to GI.  She does not understand he was being referred here D/C summary does not list a reason.  Wife wants him to follow up with a neurologist first.  If they feel he needs GI she will call back for an appointment.

## 2013-11-08 ENCOUNTER — Encounter (INDEPENDENT_AMBULATORY_CARE_PROVIDER_SITE_OTHER): Payer: Self-pay | Admitting: Radiology

## 2013-11-08 ENCOUNTER — Ambulatory Visit (INDEPENDENT_AMBULATORY_CARE_PROVIDER_SITE_OTHER): Payer: Medicare Other | Admitting: Neurology

## 2013-11-08 DIAGNOSIS — R1310 Dysphagia, unspecified: Secondary | ICD-10-CM

## 2013-11-08 DIAGNOSIS — Z0289 Encounter for other administrative examinations: Secondary | ICD-10-CM

## 2013-11-08 DIAGNOSIS — R531 Weakness: Secondary | ICD-10-CM

## 2013-11-08 NOTE — Procedures (Signed)
   NCS (NERVE CONDUCTION STUDY) WITH EMG (ELECTROMYOGRAPHY) REPORT   STUDY DATE: November 08 2013 PATIENT NAME: Joe PernaByron Feldt DOB: [birth year] MRN: [id number]    TECHNOLOGIST: Kaylyn LimSue Fox ELECTROMYOGRAPHER: Levert FeinsteinYan, Suhail Peloquin M.D.  CLINICAL INFORMATION:   80 year old gentleman, with recent diagnosis of myasthenia gravis, presenting with fatigue, dysphagia, generalized weakness  Findings examinations, he has mild-to-moderate bulbar muscle weakness, no significant limb muscle weakness.  FINDINGS: NERVE CONDUCTION STUDY: Right sural sensory response was normal. Right peroneal,  Right median sensory showed mildly prolonged peak latency, with preserved snap amplitude.  Right median motor response showed mildly prolonged distal latency, with normal CMAP amplitude, conduction velocity.  Right ulnar sensory and motor responses were normal  NEEDLE ELECTROMYOGRAPHY: Selective needle examination was performed at right upper, right lower extremity muscles, right cervical, right lumbosacral paraspinal muscles.  Needle examination of right deltoid, biceps, triceps, pronator teres was normal.   There was no spontaneous activity at right cervical paraspinal muscles, right C5-6 and 7.  Selected needle examination of right lower extremity muscle was normal including right vastus lateralis, rectus medialis, biceps femoris short head, tibialis anterior.  There was no spontaneous activity at right lumbosacral paraspinal muscles, right L4-L5 S1.  IMPRESSION:   This a mild abnormal study. There is evidence of  mild to moderate right carpal tunnel syndromes.    There is no electrodiagnostic study of large fiber peripheral neuropathy, no evidence of inflammatory myopathy, no evidence of  right lumbar, or right cervical radiculopathy.  INTERPRETING PHYSICIAN:   Levert FeinsteinYan, Junior Huezo M.D. Ph.D. [facility] [address] [place], [place] [address] [phone]

## 2013-11-10 ENCOUNTER — Encounter: Payer: Self-pay | Admitting: Neurology

## 2013-11-10 ENCOUNTER — Ambulatory Visit (INDEPENDENT_AMBULATORY_CARE_PROVIDER_SITE_OTHER): Payer: Medicare Other | Admitting: Neurology

## 2013-11-10 VITALS — BP 110/63 | HR 65 | Ht 74.0 in | Wt 181.0 lb

## 2013-11-10 DIAGNOSIS — G7000 Myasthenia gravis without (acute) exacerbation: Secondary | ICD-10-CM

## 2013-11-10 DIAGNOSIS — R4781 Slurred speech: Secondary | ICD-10-CM | POA: Insufficient documentation

## 2013-11-10 MED ORDER — PYRIDOSTIGMINE BROMIDE 60 MG PO TABS: 60.0000 mg | ORAL_TABLET | Freq: Three times a day (TID) | ORAL | Status: DC

## 2013-11-10 MED ORDER — MYCOPHENOLATE MOFETIL 500 MG PO TABS: 1000.0000 mg | ORAL_TABLET | Freq: Two times a day (BID) | ORAL | Status: DC

## 2013-11-10 MED ORDER — PREDNISONE 10 MG PO TABS: ORAL_TABLET | ORAL | Status: DC

## 2013-11-10 NOTE — Patient Instructions (Signed)
Prednisone 10mg  tabs  You are taking prednisone 20mg  2 tabs every morning since Oct 27, 20mg  2 tabs every morning for one week,   Then 30mg  every morning x one week, 20mg  every morning xone week, 10 mg every morning xone week,  Return to clinic in one month

## 2013-11-10 NOTE — Progress Notes (Signed)
PATIENT: Joe Fisher DOB: [birth year]  HISTORICAL  Joe Fisher is a pleasant 80 years old right handed Caucasian male, accompanied by his wife to follow-up his most recent hospital admission in October, for myasthenia gravis exacerbation  He had a past medical history of hyperlipidemia, otherwise healthy, highly functioning, around September 2015, he noticed intermittent double vision, by October 2015, he had a gradual worsening difficulty swallowing, neck drop, eventually leading to hospital admission in October 24 2013, he was diagnosed with seropositive myasthenia gravis, which is confirmed by acetylcholine receptor antibody with titer of 30 6.8  MRI of the brain showed No acute brain pathology. Minimal small vessel change of the cerebral hemispheric white matter, less than often seen in healthy individuals of this age. Incidental and likely insignificant venous angioma at the right parietal vertex with a small amount of hemosiderin deposition. Fluid throughout the mastoid air cells bilaterally, right more than left Laboratory evaluation showed normal or negative TSH, B12, CMP, CBC.Mother 7690 myasthenia gravis,   He was treated with IVIG from October 13 for 5 days, completed in October 29 2013, showed dramatic improvement, by the time he was discharged, he has no significant swallowing difficulty, no double vision, no limb muscle weakness.  He was also discharged with prednisone 60 mg daily, Mestinon 60 mg 3 times a day, tolerating medication well, now he is eating regular food without difficulty   REVIEW OF SYSTEMS: Full 14 system review of systems performed and notable only for as above ALLERGIES: No Known Allergies  HOME MEDICATIONS: Current Outpatient Prescriptions on File Prior to Visit  Medication Sig Dispense Refill  . aspirin EC 81 MG EC tablet Take 1 tablet (81 mg total) by mouth daily.  30 tablet  1  . CALCIUM PO Take 1 tablet by mouth daily.      Marland Kitchen. omeprazole (PRILOSEC) 40  MG capsule Take 1 capsule (40 mg total) by mouth daily.  30 capsule  1  . predniSONE (DELTASONE) 20 MG tablet Take 3 tablets (60 mg total) by mouth daily with breakfast.  90 tablet  0  . pyridostigmine (MESTINON) 60 MG/5ML syrup Take 5 mLs (60 mg total) by mouth 4 (four) times daily -  before meals and at bedtime.  473 mL  12  . rosuvastatin (CRESTOR) 10 MG tablet Take 10 mg by mouth daily.       No current facility-administered medications on file prior to visit.    PAST MEDICAL HISTORY: Past Medical History  Diagnosis Date  . Slurred speech   . Myasthenia gravis     PAST SURGICAL HISTORY: Past Surgical History  Procedure Laterality Date  . Tonsillectomy      FAMILY HISTORY: Family History  Problem Relation Age of Onset  . Myasthenia gravis Mother   . Heart attack Mother   . Heart attack Father     SOCIAL HISTORY:  History   Social History  . Marital Status: Married    Spouse Name: N/A    Number of Children: 3  . Years of Education: college   Occupational History    Retired   Social History Main Topics  . Smoking status: Former Games developermoker  . Smokeless tobacco: Never Used     Comment: Quit 48 years ago  . Alcohol Use: No  . Drug Use: No  . Sexual Activity: Not on file   Other Topics Concern  . Not on file   Social History Narrative   Patient lives at home with his wife (  [place])   Retired.   Education college.   Right handed.   Caffeine one cup daily.     PHYSICAL EXAM   Filed Vitals:   11/10/13 0951  BP: 110/63  Pulse: 65  Height: 6\' 2"  (1.88 m)  Weight: 181 lb (82.101 kg)    Not recorded    Body mass index is 23.23 kg/(m^2).   Generalized: In no acute distress  Neck: Supple, no carotid bruits   Cardiac: Regular rate rhythm  Pulmonary: Clear to auscultation bilaterally  Musculoskeletal: No deformity  Neurological examination  Mentation: Alert oriented to time, place, history taking, and causual conversation  Cranial nerve II-XII:  Pupils were equal round reactive to light. Extraocular movements were full.  Visual field were full on confrontational test. Bilateral fundi were sharp.  Facial sensation were normal. He has mild bilateral eye closure, cheek puff weakness. Hearing was intact to finger rubbing bilaterally. Uvula tongue midline.  Head turning and shoulder shrug and were normal and symmetric.Tongue protrusion into cheek strength was normal.  Motor: Normal tone, bulk and strength, with exception of slight neck flexion weakness  Sensory: Intact to fine touch, pinprick, preserved vibratory sensation, and proprioception at toes.  Coordination: Normal finger to nose, heel-to-shin bilaterally there was no truncal ataxia  Gait: Rising up from seated position without assistance, normal stance, without trunk ataxia, moderate stride, good arm swing, smooth turning, able to perform tiptoe, and heel walking without difficulty.   Romberg signs: Negative  Deep tendon reflexes: Brachioradialis 2/2, biceps 2/2, triceps 2/2, patellar 2/2, Achilles 2/2, plantar responses were flexor bilaterally.   DIAGNOSTIC DATA (LABS, IMAGING, TESTING) - I reviewed patient records, labs, notes, testing and imaging myself where available.  Lab Results  Component Value Date   WBC 4.8 10/29/2013   HGB 12.8* 10/29/2013   HCT 37.3* 10/29/2013   MCV 87.1 10/29/2013   PLT 161 10/29/2013      Component Value Date/Time   NA 139 10/29/2013 0423   K 4.0 10/29/2013 0423   CL 105 10/29/2013 0423   CO2 25 10/29/2013 0423   GLUCOSE 86 10/29/2013 0423   BUN 17 10/29/2013 0423   CREATININE 1.06 10/29/2013 0423   CALCIUM 8.6 10/29/2013 0423   PROT 7.9 10/24/2013 1407   ALBUMIN 4.2 10/24/2013 1407   AST 40* 10/24/2013 1407   ALT 22 10/24/2013 1407   ALKPHOS 102 10/24/2013 1407   BILITOT 0.7 10/24/2013 1407   GFRNONAA 65* 10/29/2013 0423   GFRAA 75* 10/29/2013 0423   No results found for this basename: CHOL, HDL, LDLCALC, LDLDIRECT, TRIG,  CHOLHDL   No results found for this basename: HGBA1C   Lab Results  Component Value Date   VITAMINB12 1389* 10/24/2013   Lab Results  Component Value Date   TSH 2.890 10/24/2013      ASSESSMENT AND PLAN  Joe Fisher is a 80 y.o. male with seropositive generalized myasthenia gravis presented with neck drop, swallowing difficulty, intermittent diplopia, responding very well to IVIG treatment,  1, continue tapering down prednisone dosage, 10 mg decrement every week, to target of 10 mg every morning 2. Add on CellCept 500 mg 2 tablets twice a day, potential side effects explained 3. CT chest rule out thymus pathology 4. Mestinon 60 mg as needed 5, return to clinic in 1 month   Levert FeinsteinYijun Malasia Torain, M.D. Ph.D.  [facility] [address], [address] [place], [place] [address] [phone]

## 2013-11-15 ENCOUNTER — Telehealth: Payer: Self-pay | Admitting: Neurology

## 2013-11-15 NOTE — Telephone Encounter (Addendum)
[facility] recommended doing a peer to peer review.  Patient is scheduled for 11/18/13 Please call for review [phone] option #3 for peer to peer   case# [id number]

## 2013-11-15 NOTE — Telephone Encounter (Signed)
Joe Fisher, please call peer to peer review for me. Thanks.

## 2013-11-17 NOTE — Telephone Encounter (Signed)
Dr.Yan will do peer to peer today 11-17-2013.

## 2013-11-17 NOTE — Telephone Encounter (Signed)
Called insurance CT need not need a peer to peer - Approval #  [id number] exp. 01-01-2014

## 2013-11-17 NOTE — Telephone Encounter (Signed)
Patient appointment for MRI is 11/18/13, please advise on the peer to peer

## 2013-11-18 ENCOUNTER — Telehealth: Payer: Self-pay | Admitting: Neurology

## 2013-11-18 ENCOUNTER — Ambulatory Visit
Admission: RE | Admit: 2013-11-18 | Discharge: 2013-11-18 | Disposition: A | Discharge status: Home / Self Care | Payer: Medicare Other | Source: Ambulatory Visit | Attending: Neurology | Admitting: Neurology

## 2013-11-18 DIAGNOSIS — G7000 Myasthenia gravis without (acute) exacerbation: Secondary | ICD-10-CM

## 2013-11-18 NOTE — Telephone Encounter (Signed)
I called him, fail to reach, could not leave message either.  Annabelle HarmanDana, please call patient, there was  No acute abnormalities on CT chest,   If he has questions, will discuss detail at his follow up visit

## 2013-11-25 NOTE — Telephone Encounter (Signed)
I was unable to reach patient sent him a letter.

## 2013-12-14 ENCOUNTER — Ambulatory Visit (INDEPENDENT_AMBULATORY_CARE_PROVIDER_SITE_OTHER): Payer: Medicare Other | Admitting: Neurology

## 2013-12-14 ENCOUNTER — Encounter: Payer: Self-pay | Admitting: Neurology

## 2013-12-14 VITALS — BP 91/61 | HR 69 | Ht 74.0 in | Wt 184.0 lb

## 2013-12-14 DIAGNOSIS — G7000 Myasthenia gravis without (acute) exacerbation: Secondary | ICD-10-CM

## 2013-12-14 MED ORDER — AZATHIOPRINE 50 MG PO TABS: ORAL_TABLET | ORAL | Status: DC

## 2013-12-14 NOTE — Patient Instructions (Signed)
You are now taking prednisone 10mg  2 tabs every morning.  Starting Dec 7th Monday, you should take prednisone 10mg  1 tab every morning   Finish Cellcept  Start Imuran=azathioprine 50mg  tabs  Imuran 50mg  one tab twice a day for one week, Dec7th-Dec 13th Then Imuran 50mg  2 tabs twice a day

## 2013-12-14 NOTE — Progress Notes (Signed)
PATIENT: Joe Fisher DOB: [birth year]  HISTORICAL  Joe Fisher is a pleasant 80 years old right handed Caucasian male, accompanied by his wife to follow-up for seropositive generalized myasthenia gravis.  He had a past medical history of hyperlipidemia, otherwise healthy, highly functioning  In 09/2013, he noticed intermittent double vision, by 10/2013, he had a gradual worsening difficulty swallowing, neck drop, eventually leading to hospital admission in October 24 2013, he was diagnosed with seropositive myasthenia gravis, which is confirmed by acetylcholine receptor antibody with titer of 38.8  MRI of the brain showed No acute brain pathology. Minimal small vessel change of the cerebral hemispheric white matter. Incidental and likely insignificant venous angioma at the right parietal vertex with a small amount of hemosiderin deposition. Fluid throughout the mastoid air cells bilaterally, right more than left  Laboratory evaluation showed normal or negative TSH, B12, CMP, CBC positive myasthenia gravis 38  He responded to IVIG very well, IVIG from October 13-17th 2015, showed dramatic improvement, by the time he was discharged, he has no significant swallowing difficulty, no double vision, no limb muscle weakness.  He was also discharged with prednisone 60 mg daily, Mestinon 60 mg 3 times a day, tolerating medication well, now he is eating regular food without difficulty  Cellcept Nov 2015-Dec 2015, stop due to high cost. Prednisone since Oct 17th 60mg  tapering down  UPDATE Dec 2nd 2015: CT chest:No evidence of thymoma. No evidence of mediastinal mass or lymphadenopathy. COPD/emphysema. No acute cardiopulmonary disease  He denies iplopia, no gait idfficulty, no longer taking Mestinon,  He is taking prednisone 10mg  2 tabs a day for 2 weeks now, it was tapered down from 60 mg daily, he is also taking CellCept 500 mg 2 tablets twice a day, but complains of high monthly cost of more  than USD600    REVIEW OF SYSTEMS: Full 14 system review of systems performed and notable only for as above  ALLERGIES: No Known Allergies  HOME MEDICATIONS: Current Outpatient Prescriptions on File Prior to Visit  Medication Sig Dispense Refill  . aspirin EC 81 MG EC tablet Take 1 tablet (81 mg total) by mouth daily. 30 tablet 1  . CALCIUM PO Take 1 tablet by mouth daily.    . mycophenolate (CELLCEPT) 500 MG tablet Take 2 tablets (1,000 mg total) by mouth 2 (two) times daily. 120 tablet 11  . omeprazole (PRILOSEC) 40 MG capsule Take 1 capsule (40 mg total) by mouth daily. 30 capsule 1  . predniSONE (DELTASONE) 10 MG tablet Take as instructed. 90 tablet 3  . rosuvastatin (CRESTOR) 10 MG tablet Take 10 mg by mouth daily.     No current facility-administered medications on file prior to visit.    PAST MEDICAL HISTORY: Past Medical History  Diagnosis Date  . Slurred speech   . Myasthenia gravis     PAST SURGICAL HISTORY: Past Surgical History  Procedure Laterality Date  . Tonsillectomy      FAMILY HISTORY: Family History  Problem Relation Age of Onset  . Myasthenia gravis Mother   . Heart attack Mother   . Heart attack Father     SOCIAL HISTORY:  History   Social History  . Marital Status: Married    Spouse Name: N/A    Number of Children: 3  . Years of Education: college   Occupational History    Retired   Social History Main Topics  . Smoking status: Former Games developermoker  . Smokeless tobacco: Never Used  Comment: Quit 48 years ago  . Alcohol Use: No  . Drug Use: No  . Sexual Activity: Not on file   Other Topics Concern  . Not on file   Social History Narrative   Patient lives at home with his wife Joe Fisher(Junaita)   Retired.   Education college.   Right handed.   Caffeine one cup daily.     PHYSICAL EXAM   Filed Vitals:   12/14/13 0957  BP: 91/61  Pulse: 69  Height: 6\' 2"  (1.88 m)  Weight: 184 lb (83.462 kg)    Not recorded      Body mass  index is 23.61 kg/(m^2).   Generalized: In no acute distress  Neck: Supple, no carotid bruits   Cardiac: Regular rate rhythm  Pulmonary: Clear to auscultation bilaterally  Musculoskeletal: No deformity  Neurological examination  Mentation: Alert oriented to time, place, history taking, and causual conversation  Cranial nerve II-XII: Pupils were equal round reactive to light. Extraocular movements were full.  Visual field were full on confrontational test. Bilateral fundi were sharp.  Facial sensation were normal. He has mild bilateral eye closure, cheek puff weakness. Hearing was intact to finger rubbing bilaterally. Uvula tongue midline.  Head turning and shoulder shrug and were normal and symmetric.Tongue protrusion into cheek strength was normal.  Motor: Normal tone, bulk and strength, with exception of slight neck flexion weakness  Sensory: Intact to fine touch, pinprick, preserved vibratory sensation, and proprioception at toes.  Coordination: Normal finger to nose, heel-to-shin bilaterally there was no truncal ataxia  Gait: Rising up from seated position without assistance, normal stance, without trunk ataxia, moderate stride, good arm swing, smooth turning, able to perform tiptoe, and heel walking without difficulty.   Romberg signs: Negative  Deep tendon reflexes: Brachioradialis 2/2, biceps 2/2, triceps 2/2, patellar 2/2, Achilles 2/2, plantar responses were flexor bilaterally.   DIAGNOSTIC DATA (LABS, IMAGING, TESTING) - I reviewed patient records, labs, notes, testing and imaging myself where available.  Lab Results  Component Value Date   WBC 4.8 10/29/2013   HGB 12.8* 10/29/2013   HCT 37.3* 10/29/2013   MCV 87.1 10/29/2013   PLT 161 10/29/2013      Component Value Date/Time   NA 139 10/29/2013 0423   K 4.0 10/29/2013 0423   CL 105 10/29/2013 0423   CO2 25 10/29/2013 0423   GLUCOSE 86 10/29/2013 0423   BUN 17 10/29/2013 0423   CREATININE 1.06 10/29/2013  0423   CALCIUM 8.6 10/29/2013 0423   PROT 7.9 10/24/2013 1407   ALBUMIN 4.2 10/24/2013 1407   AST 40* 10/24/2013 1407   ALT 22 10/24/2013 1407   ALKPHOS 102 10/24/2013 1407   BILITOT 0.7 10/24/2013 1407   GFRNONAA 65* 10/29/2013 0423   GFRAA 75* 10/29/2013 0423   No results found for: CHOL No results found for: HGBA1C Lab Results  Component Value Date   VITAMINB12 1389* 10/24/2013   Lab Results  Component Value Date   TSH 2.890 10/24/2013      ASSESSMENT AND PLAN  Joe PernaByron Chastain is a 80 y.o. male with seropositive generalized myasthenia gravis since September 2015, presented with neck drop, swallowing difficulty, intermittent diplopia, responding very well to IVIG treatment, CT chest showed no evidence of thymoma  1, continue tapering down prednisone dosage to 10 mg daily  2.  Imuran 50mg  ii bid 3. RTC in one month, lab CMP, CBC    Levert FeinsteinYijun Latrease Kunde, M.D. Ph.D.  [facility] [address], [address]  [place], [place] [address] [phone]

## 2014-01-16 ENCOUNTER — Telehealth: Payer: Self-pay | Admitting: Neurology

## 2014-01-16 MED ORDER — AZATHIOPRINE 50 MG PO TABS: 100.0000 mg | ORAL_TABLET | Freq: Two times a day (BID) | ORAL | Status: DC

## 2014-01-16 NOTE — Telephone Encounter (Signed)
Pt's wife is calling and stating pt needs a refill on azaTHIOprine (IMURAN) 50 MG tablet, she called the pharmacy and they stated it was too early to refill.  He only has enough for tomorrow.  Please call and advise.

## 2014-01-16 NOTE — Telephone Encounter (Signed)
I called the pharmacy, they said Rx was not refill too soon.  They state patient had an ins change effective Jan 1st.  I have resent the Rx.  I called back, they are aware.   Pharmacy will now bill Rx under the following info: Member ID [id number]  RxGroup [id number]  RxBin [id number]  RxPCN [id number]

## 2014-01-19 ENCOUNTER — Encounter: Payer: Self-pay | Admitting: Neurology

## 2014-01-19 ENCOUNTER — Ambulatory Visit (INDEPENDENT_AMBULATORY_CARE_PROVIDER_SITE_OTHER): Payer: Medicare Other | Admitting: Neurology

## 2014-01-19 VITALS — BP 110/60 | HR 58 | Ht 74.0 in | Wt 182.0 lb

## 2014-01-19 DIAGNOSIS — G7000 Myasthenia gravis without (acute) exacerbation: Secondary | ICD-10-CM

## 2014-01-19 MED ORDER — PREDNISONE 5 MG PO TABS: 5.0000 mg | ORAL_TABLET | Freq: Every day | ORAL | Status: DC

## 2014-01-19 NOTE — Progress Notes (Signed)
PATIENT: Joe Fisher DOB: 09-17-1932  HISTORICAL  Foy Vanduyne is a pleasant 81 years old right handed Caucasian male, accompanied by his wife to follow-up for seropositive generalized myasthenia gravis.  He had a past medical history of hyperlipidemia, otherwise healthy, highly functioning  In 09/2013, he noticed intermittent double vision, by 10/2013, he had a gradual worsening difficulty swallowing, neck drop, eventually leading to hospital admission in October 24 2013, he was diagnosed with seropositive myasthenia gravis, which is confirmed by acetylcholine receptor antibody with titer of 38.8  MRI of the brain showed No acute brain pathology. Minimal small vessel change of the cerebral hemispheric white matter. Incidental and likely insignificant venous angioma at the right parietal vertex with a small amount of hemosiderin deposition. Fluid throughout the mastoid air cells bilaterally, right more than left  Laboratory evaluation showed normal or negative TSH, B12, CMP, CBC positive myasthenia gravis 38  He responded to IVIG very well, IVIG from October 13-17th 2015, showed dramatic improvement, by the time he was discharged, he has no significant swallowing difficulty, no double vision, no limb muscle weakness.  He was also discharged with prednisone 60 mg daily, Mestinon 60 mg 3 times a day, tolerating medication well, now he is eating regular food without difficulty  Cellcept Nov 2015-Dec 2015, stop due to high cost. Prednisone since Oct 17th  tapering down  UPDATE Dec 2nd 2015: CT chest:No evidence of thymoma. No evidence of mediastinal mass or lymphadenopathy. COPD/emphysema. No acute cardiopulmonary disease  He denies iplopia, no gait idfficulty, no longer taking Mestinon,  He is taking prednisone  2 tabs a day for 2 weeks now, it was tapered down from 60 mg daily, he is also taking CellCept 500 mg 2 tablets twice a day, but complains of high monthly cost of more  than USD600   UPDATE Jan 7th 2016: He is dong well on tapering dose of prednisone, he is now on prednisone 10 mg every day since and of December 2015, also taking Imuran 50 mg 2 tablets twice a day, he has no recurrent bulbar, ocular, or limb muscle weakness  REVIEW OF SYSTEMS: Full 14 system review of systems performed and notable only for as above  ALLERGIES: No Known Allergies  HOME MEDICATIONS: Current Outpatient Prescriptions on File Prior to Visit  Medication Sig Dispense Refill  . aspirin EC 81 MG EC tablet Take 1 tablet (81 mg total) by mouth daily. 30 tablet 1  . azaTHIOprine (IMURAN) 50 MG tablet Take 2 tablets (100 mg total) by mouth 2 (two) times daily. 120 tablet 11  . CALCIUM PO Take 1 tablet by mouth daily.    . predniSONE (DELTASONE) 10 MG tablet Take as instructed. 90 tablet 3  . rosuvastatin (CRESTOR) 10 MG tablet Take 10 mg by mouth daily.     No current facility-administered medications on file prior to visit.    PAST MEDICAL HISTORY: Past Medical History  Diagnosis Date  . Slurred speech   . Myasthenia gravis     PAST SURGICAL HISTORY: Past Surgical History  Procedure Laterality Date  . Tonsillectomy      FAMILY HISTORY: Family History  Problem Relation Age of Onset  . Myasthenia gravis Mother   . Heart attack Mother   . Heart attack Father     SOCIAL HISTORY:  History   Social History  . Marital Status: Married    Spouse Name: N/A    Number of Children: 3  . Years of Education: college  Occupational History    Retired   Social History Main Topics  . Smoking status: Former Games developer  . Smokeless tobacco: Never Used     Comment: Quit 48 years ago  . Alcohol Use: No  . Drug Use: No  . Sexual Activity: Not on file   Other Topics Concern  . Not on file   Social History Narrative   Patient lives at home with his wife Gertha Calkin)   Retired.   Education college.   Right handed.   Caffeine one cup daily.     PHYSICAL EXAM   Filed  Vitals:   01/19/14 1426  BP: 110/60  Pulse: 58  Height:  (1.88 m)  Weight: 182 lb (82.555 kg)    Not recorded      Body mass index is 23.36 kg/(m^2).   Generalized: In no acute distress  Neck: Supple, no carotid bruits   Cardiac: Regular rate rhythm  Pulmonary: Clear to auscultation bilaterally  Musculoskeletal: No deformity  Neurological examination  Mentation: Alert oriented to time, place, history taking, and causual conversation  Cranial nerve II-XII: Pupils were equal round reactive to light. Extraocular movements were full.  Visual field were full on confrontational test. Bilateral fundi were sharp.  Facial sensation were normal. He has mild bilateral eye closure, cheek puff weakness. Hearing was intact to finger rubbing bilaterally. Uvula tongue midline.  Head turning and shoulder shrug and were normal and symmetric.Tongue protrusion into cheek strength was normal.  Motor: Normal tone, bulk and strength, with exception of slight neck flexion weakness  Sensory: Intact to fine touch, pinprick, preserved vibratory sensation, and proprioception at toes.  Coordination: Normal finger to nose, heel-to-shin bilaterally there was no truncal ataxia  Gait: Rising up from seated position without assistance, normal stance, without trunk ataxia, moderate stride, good arm swing, smooth turning, able to perform tiptoe, and heel walking without difficulty.   Romberg signs: Negative  Deep tendon reflexes: Brachioradialis 2/2, biceps 2/2, triceps 2/2, patellar 2/2, Achilles 2/2, plantar responses were flexor bilaterally.   DIAGNOSTIC DATA (LABS, IMAGING, TESTING) - I reviewed patient records, labs, notes, testing and imaging myself where available.  Lab Results  Component Value Date   WBC 4.8 10/29/2013   HGB 12.8* 10/29/2013   HCT 37.3* 10/29/2013   MCV 87.1 10/29/2013   PLT 161 10/29/2013      Component Value Date/Time   NA 139 10/29/2013 0423   K 4.0 10/29/2013  0423   CL 105 10/29/2013 0423   CO2 25 10/29/2013 0423   GLUCOSE 86 10/29/2013 0423   BUN 17 10/29/2013 0423   CREATININE 1.06 10/29/2013 0423   CALCIUM 8.6 10/29/2013 0423   PROT 7.9 10/24/2013 1407   ALBUMIN 4.2 10/24/2013 1407   AST 40* 10/24/2013 1407   ALT 22 10/24/2013 1407   ALKPHOS 102 10/24/2013 1407   BILITOT 0.7 10/24/2013 1407   GFRNONAA 65* 10/29/2013 0423   GFRAA 75* 10/29/2013 0423   No results found for: CHOL No results found for: HGBA1C Lab Results  Component Value Date   VITAMINB12 1389* 10/24/2013   Lab Results  Component Value Date   TSH 2.890 10/24/2013      ASSESSMENT AND PLAN  Zebulon Gantt is a 81 y.o. male with seropositive generalized myasthenia gravis since September 2015, presented with neck drop, swallowing difficulty, intermittent diplopia, responding very well to IVIG treatment, CT chest showed no evidence of thymoma, CellCept cause high co-pay  1, continue tapering down prednisone dosage to 5 mg  daily  2.  Imuran 50mg  ii bid 3. Laboratory today 4. Return to clinic in 6 months  Levert FeinsteinYijun Sherryann Frese, M.D. Ph.D.  [facility] [address], [address] [place], [place] [address] [phone]

## 2014-01-20 LAB — CBC WITH DIFFERENTIAL
BASOS ABS: 0 10*3/uL (ref 0.0–0.2)
BASOS: 0 %
Eos: 1 %
Eosinophils Absolute: 0.1 10*3/uL (ref 0.0–0.4)
HCT: 41.3 % (ref 37.5–51.0)
HEMOGLOBIN: 14.1 g/dL (ref 12.6–17.7)
IMMATURE GRANS (ABS): 0 10*3/uL (ref 0.0–0.1)
Immature Granulocytes: 0 %
Lymphocytes Absolute: 0.5 10*3/uL — ABNORMAL LOW (ref 0.7–3.1)
Lymphs: 7 %
MCH: 30.5 pg (ref 26.6–33.0)
MCHC: 34.1 g/dL (ref 31.5–35.7)
MCV: 89 fL (ref 79–97)
Monocytes Absolute: 0.4 10*3/uL (ref 0.1–0.9)
Monocytes: 5 %
Neutrophils Absolute: 6.1 10*3/uL (ref 1.4–7.0)
Neutrophils Relative %: 87 %
PLATELETS: 269 10*3/uL (ref 150–379)
RBC: 4.63 x10E6/uL (ref 4.14–5.80)
RDW: 16.1 % — AB (ref 12.3–15.4)
WBC: 7.1 10*3/uL (ref 3.4–10.8)

## 2014-01-20 LAB — COMPREHENSIVE METABOLIC PANEL
ALK PHOS: 61 IU/L (ref 39–117)
ALT: 20 IU/L (ref 0–44)
AST: 27 IU/L (ref 0–40)
Albumin/Globulin Ratio: 2.2 (ref 1.1–2.5)
Albumin: 4.4 g/dL (ref 3.5–4.8)
BILIRUBIN TOTAL: 0.6 mg/dL (ref 0.0–1.2)
BUN/Creatinine Ratio: 23 — ABNORMAL HIGH (ref 10–22)
BUN: 24 mg/dL (ref 8–27)
CALCIUM: 9.5 mg/dL (ref 8.6–10.2)
CO2: 28 mmol/L (ref 18–29)
Chloride: 100 mmol/L (ref 97–108)
Creatinine, Ser: 1.05 mg/dL (ref 0.76–1.27)
GFR calc Af Amer: 78 mL/min/{1.73_m2} (ref >59)
GFR, EST NON AFRICAN AMERICAN: 67 mL/min/{1.73_m2} (ref >59)
Globulin, Total: 2 g/dL (ref 1.5–4.5)
Glucose: 101 mg/dL — ABNORMAL HIGH (ref 65–99)
POTASSIUM: 4.6 mmol/L (ref 3.5–5.2)
Sodium: 141 mmol/L (ref 134–144)
Total Protein: 6.4 g/dL (ref 6.0–8.5)

## 2014-07-20 ENCOUNTER — Ambulatory Visit: Payer: Medicare Other | Admitting: Neurology

## 2014-07-27 ENCOUNTER — Ambulatory Visit: Payer: Self-pay | Admitting: Neurology

## 2014-07-28 ENCOUNTER — Ambulatory Visit: Payer: Self-pay | Admitting: Neurology

## 2014-08-07 ENCOUNTER — Encounter: Payer: Self-pay | Admitting: Neurology

## 2014-08-07 ENCOUNTER — Telehealth: Payer: Self-pay | Admitting: Neurology

## 2014-08-07 ENCOUNTER — Ambulatory Visit (INDEPENDENT_AMBULATORY_CARE_PROVIDER_SITE_OTHER): Payer: Medicare Other | Admitting: Neurology

## 2014-08-07 ENCOUNTER — Ambulatory Visit: Payer: Medicare Other | Admitting: Neurology

## 2014-08-07 VITALS — BP 129/70 | HR 63 | Ht 74.0 in | Wt 173.0 lb

## 2014-08-07 DIAGNOSIS — G7000 Myasthenia gravis without (acute) exacerbation: Secondary | ICD-10-CM

## 2014-08-07 MED ORDER — PREDNISONE 2.5 MG PO TABS: ORAL_TABLET | ORAL | Status: DC

## 2014-08-07 NOTE — Progress Notes (Signed)
Chief Complaint  Patient presents with  . Myasthenia Gravis    He is here with his wife, Joe Fisher.  Feels he is doing well overall.  He has no new concerns today.  Reports his PCP just instructed him to discontinue use of aspirin, calcium and Crestor recently.      PATIENT: Joe Fisher DOB: 1932/04/20  HISTORICAL  Joe Fisher is a pleasant 81 years old right handed Caucasian male, accompanied by his wife to follow-up for seropositive generalized myasthenia gravis.  He had a past medical history of hyperlipidemia, otherwise healthy, highly functioning  In 09/2013, he noticed intermittent double vision, by 10/2013, he had a gradual worsening difficulty swallowing, neck drop, eventually leading to hospital admission in October 24 2013, he was diagnosed with seropositive myasthenia gravis, which is confirmed by acetylcholine receptor antibody with titer of 38.8  MRI of the brain showed No acute brain pathology. Minimal small vessel change of the cerebral hemispheric white matter. Incidental and likely insignificant venous angioma at the right parietal vertex with a small amount of hemosiderin deposition. Fluid throughout the mastoid air cells bilaterally, right more than left  Laboratory evaluation showed normal or negative TSH, B12, CMP, CBC positive myasthenia gravis 38  He responded to IVIG very well, IVIG from October 13-17th 2015, showed dramatic improvement, by the time he was discharged, he has no significant swallowing difficulty, no double vision, no limb muscle weakness.  He was also discharged with prednisone 60 mg daily, Mestinon 60 mg 3 times a day, tolerating medication well, now he is eating regular food without difficulty  Cellcept Nov 2015-Dec 2015, stop due to high cost. Prednisone since Oct 17th 2015 starting from 60mg  tapering down  UPDATE Dec 2nd 2015: CT chest:No evidence of thymoma. No evidence of mediastinal mass or lymphadenopathy. COPD/emphysema. No acute  cardiopulmonary disease  He denies iplopia, no gait idfficulty, no longer taking Mestinon,  He is taking prednisone 10mg  2 tabs a day for 2 weeks now, it was tapered down from 60 mg daily, he is also taking CellCept 500 mg 2 tablets twice a day, but complains of high monthly cost of more than USD600   UPDATE Jan 7th 2016: He is dong well on tapering dose of prednisone, he is now on prednisone 10 mg every day since and of December 2015, also taking Imuran 50 mg 2 tablets twice a day, he has no recurrent bulbar, ocular, or limb muscle weakness  UPDATE August 07 2014: He has no double vision, no dysarhtria, dysphagia, denies limb muscle weakness, no gait difficulty,  He is currently taking Imuran 50 mg 2 tablets twice a day, prednisone 10 mg daily  REVIEW OF SYSTEMS: Full 14 system review of systems performed and notable only for as above  ALLERGIES: No Known Allergies  HOME MEDICATIONS: Current Outpatient Prescriptions on File Prior to Visit  Medication Sig Dispense Refill  . azaTHIOprine (IMURAN) 50 MG tablet Take 2 tablets (100 mg total) by mouth 2 (two) times daily. 120 tablet 11  . predniSONE (DELTASONE) 5 MG tablet Take 1 tablet (5 mg total) by mouth daily with breakfast. 60 tablet 6   No current facility-administered medications on file prior to visit.    PAST MEDICAL HISTORY: Past Medical History  Diagnosis Date  . Slurred speech   . Myasthenia gravis     PAST SURGICAL HISTORY: Past Surgical History  Procedure Laterality Date  . Tonsillectomy      FAMILY HISTORY: Family History  Problem Relation Age of Onset  .  Myasthenia gravis Mother   . Heart attack Mother   . Heart attack Father     SOCIAL HISTORY:  History   Social History  . Marital Status: Married    Spouse Name: N/A    Number of Children: 3  . Years of Education: college   Occupational History    Retired   Social History Main Topics  . Smoking status: Former Games developer  . Smokeless tobacco: Never  Used     Comment: Quit 48 years ago  . Alcohol Use: No  . Drug Use: No  . Sexual Activity: Not on file   Other Topics Concern  . Not on file   Social History Narrative   Patient lives at home with his wife Joe Fisher)   Retired.   Education college.   Right handed.   Caffeine one cup daily.     PHYSICAL EXAM   Filed Vitals:   08/07/14 1554  BP: 129/70  Pulse: 63  Height:  (1.88 m)  Weight: 173 lb (78.472 kg)    Not recorded      Body mass index is 22.2 kg/(m^2).  PHYSICAL EXAMNIATION:  Gen: NAD, conversant, well nourised, obese, well groomed                     Cardiovascular: Regular rate rhythm, no peripheral edema, warm, nontender. Eyes: Conjunctivae clear without exudates or hemorrhage Neck: Supple, no carotid bruise. Pulmonary: Clear to auscultation bilaterally   NEUROLOGICAL EXAM:  MENTAL STATUS: Speech:    Speech is normal; fluent and spontaneous with normal comprehension.  Cognition:    The patient is oriented to person, place, and time;     recent and remote memory intact;     language fluent;     normal attention, concentration,     fund of knowledge.  CRANIAL NERVES: CN II: Visual fields are full to confrontation. Fundoscopic exam is normal with sharp discs and no vascular changes. Pupils were equal round reactive to light CN III, IV, VI: extraocular movement are normal. No ptosis. CN V: Facial sensation is intact to pinprick in all 3 divisions bilaterally. Corneal responses are intact.  CN VII: Face is symmetric with normal eye closure and smile. CN VIII: Hearing is normal to rubbing fingers CN IX, X: Palate elevates symmetrically. Phonation is normal. CN XI: Head turning and shoulder shrug are intact CN XII: Tongue is midline with normal movements and no atrophy.  MOTOR: There is no pronator drift of out-stretched arms. Muscle bulk and tone are normal. Muscle strength is normal.  REFLEXES: Reflexes are 2+ and symmetric at the biceps,  triceps, knees, and ankles. Plantar responses are flexor.  SENSORY: Light touch, pinprick, position sense, and vibration sense are intact in fingers and toes.  COORDINATION: Rapid alternating movements and fine finger movements are intact. There is no dysmetria on finger-to-nose and heel-knee-shin. There are no abnormal or extraneous movements.   GAIT/STANCE: Posture is normal. Gait is steady with normal steps, base, arm swing, and turning. Heel and toe walking are normal. Tandem gait is normal.  Romberg is absent.   DIAGNOSTIC DATA (LABS, IMAGING, TESTING) - I reviewed patient records, labs, notes, testing and imaging myself where available.  Lab Results  Component Value Date   WBC 7.1 01/19/2014   HGB 14.1 01/19/2014   HCT 41.3 01/19/2014   MCV 89 01/19/2014   PLT 269 01/19/2014      Component Value Date/Time   NA 141 01/19/2014 1459  NA 139 10/29/2013 0423   K 4.6 01/19/2014 1459   CL 100 01/19/2014 1459   CO2 28 01/19/2014 1459   GLUCOSE 101* 01/19/2014 1459   GLUCOSE 86 10/29/2013 0423   BUN 24 01/19/2014 1459   BUN 17 10/29/2013 0423   CREATININE 1.05 01/19/2014 1459   CALCIUM 9.5 01/19/2014 1459   PROT 6.4 01/19/2014 1459   PROT 7.9 10/24/2013 1407   ALBUMIN 4.2 10/24/2013 1407   AST 27 01/19/2014 1459   ALT 20 01/19/2014 1459   ALKPHOS 61 01/19/2014 1459   BILITOT 0.6 01/19/2014 1459   GFRNONAA 67 01/19/2014 1459   GFRAA 78 01/19/2014 1459   No results found for: CHOL No results found for: HGBA1C Lab Results  Component Value Date   VITAMINB12 1389* 10/24/2013   Lab Results  Component Value Date   TSH 2.890 10/24/2013      ASSESSMENT AND PLAN  Joe Fisher is a 81 y.o. male with seropositive generalized myasthenia gravis since September 2015, presented with neck drop, swallowing difficulty, intermittent diplopia, responding very well to IVIG treatment, CT chest showed no evidence of thymoma, CellCept cause high co-pay  He is doing very well with  current combination of Imuran and tapering dose of prednisone, today's examination showed no significant bulbar or limb muscle weakness  1, continue tapering down prednisone dosage to 2.5 mg daily for 2 month  2.  Imuran 50mg  ii bid 3. Return to clinic in 6 months, call office for worsening symptoms  Levert Feinstein, M.D. Ph.D.  [facility] [address] [place], [place] [address] [phone]

## 2014-08-07 NOTE — Telephone Encounter (Signed)
Joe Fisher with [facility] called to verify a medication Rx. PREDNISONE. Please call and advise.

## 2014-08-07 NOTE — Telephone Encounter (Signed)
I called back.  Spoke with Nettie Elm.  [facility] they will fill Rx as prescribed, but #30 will only last 1 month, so they will request additional refill when needed since Rx says take for 2 months.

## 2014-08-08 ENCOUNTER — Ambulatory Visit: Payer: Self-pay | Admitting: Neurology

## 2014-12-26 ENCOUNTER — Telehealth: Payer: Self-pay

## 2014-12-26 NOTE — Telephone Encounter (Signed)
Called patient to offer appt w/ NP-Megan. No answer. Will try later.

## 2015-01-15 ENCOUNTER — Other Ambulatory Visit: Payer: Self-pay | Admitting: Neurology

## 2015-01-18 ENCOUNTER — Other Ambulatory Visit: Payer: Self-pay | Admitting: Neurology

## 2015-02-05 ENCOUNTER — Ambulatory Visit (INDEPENDENT_AMBULATORY_CARE_PROVIDER_SITE_OTHER): Payer: Medicare Other | Admitting: Neurology

## 2015-02-05 ENCOUNTER — Encounter: Payer: Self-pay | Admitting: Neurology

## 2015-02-05 VITALS — BP 133/75 | HR 64 | Ht 74.0 in | Wt 182.0 lb

## 2015-02-05 DIAGNOSIS — G7000 Myasthenia gravis without (acute) exacerbation: Secondary | ICD-10-CM

## 2015-02-05 DIAGNOSIS — R413 Other amnesia: Secondary | ICD-10-CM

## 2015-02-05 MED ORDER — AZATHIOPRINE 50 MG PO TABS: 100.0000 mg | ORAL_TABLET | Freq: Two times a day (BID) | ORAL | Status: AC

## 2015-02-05 NOTE — Progress Notes (Signed)
Chief Complaint  Patient presents with  . Myasthenia Gravis    He is here with his wife, [place]. No new concerns today.  Feels he is doing well overall.      PATIENT: Joe Fisher DOB: 06/01/1932  HISTORICAL  Joe Fisher is a pleasant 82 years old right handed Caucasian male, accompanied by his wife to follow-up for seropositive generalized myasthenia gravis.  He had a past medical history of hyperlipidemia, otherwise healthy, highly functioning  In 09/2013, he noticed intermittent double vision, by 10/2013, he had a gradual worsening difficulty swallowing, neck drop, eventually leading to hospital admission in October 24 2013, he was diagnosed with seropositive myasthenia gravis, which is confirmed by acetylcholine receptor antibody with titer of 38.8  MRI of the brain showed No acute brain pathology. Minimal small vessel change of the cerebral hemispheric white matter. Incidental and likely insignificant venous angioma at the right parietal vertex with a small amount of hemosiderin deposition. Fluid throughout the mastoid air cells bilaterally, right more than left  Laboratory evaluation showed normal or negative TSH, B12, CMP, CBC positive myasthenia gravis 38  He responded to IVIG very well, IVIG from October 13-17th 2015, showed dramatic improvement, by the time he was discharged, he has no significant swallowing difficulty, no double vision, no limb muscle weakness.  He was also discharged with prednisone 60 mg daily, Mestinon 60 mg 3 times a day, tolerating medication well, now he is eating regular food without difficulty  Cellcept Nov 2015-Dec 2015, stop due to high cost. Prednisone since Oct 17th 2015 starting from  tapering down  UPDATE Dec 2nd 2015: CT chest:No evidence of thymoma. No evidence of mediastinal mass or lymphadenopathy. COPD/emphysema. No acute cardiopulmonary disease  He denies iplopia, no gait idfficulty, no longer taking Mestinon,  He is taking  prednisone  2 tabs a day for 2 weeks now, it was tapered down from 60 mg daily, he is also taking CellCept 500 mg 2 tablets twice a day, but complains of high monthly cost of more than USD600   UPDATE Jan 7th 2016: He is dong well on tapering dose of prednisone, he is now on prednisone 10 mg every day since and of December 2015, also taking Imuran 50 mg 2 tablets twice a day, he has no recurrent bulbar, ocular, or limb muscle weakness  UPDATE August 07 2014: He has no double vision, no dysarhtria, dysphagia, denies limb muscle weakness, no gait difficulty,  He is currently taking Imuran 50 mg 2 tablets twice a day, prednisone 10 mg daily  UPDATE Feb 05 2015: He does exercise routinely, he has no trouble walking, he walks 2 miles daily.  He has tapered off daily prednisone for few months, he has worsening of his symptoms. He continues to have mild memory loss, he still drives.  REVIEW OF SYSTEMS: Full 14 system review of systems performed and notable only: Memory loss  ALLERGIES: No Known Allergies  HOME MEDICATIONS: Current Outpatient Prescriptions on File Prior to Visit  Medication Sig Dispense Refill  . azaTHIOprine (IMURAN) 50 MG tablet TAKE 2 TABLETS (100 MG TOTAL) BY MOUTH 2 (TWO) TIMES DAILY. 120 tablet 0   No current facility-administered medications on file prior to visit.    PAST MEDICAL HISTORY: Past Medical History  Diagnosis Date  . Slurred speech   . Myasthenia gravis (HCC)     PAST SURGICAL HISTORY: Past Surgical History  Procedure Laterality Date  . Tonsillectomy      FAMILY HISTORY: Family History  Problem Relation Age of Onset  . Myasthenia gravis Mother   . Heart attack Mother   . Heart attack Father     SOCIAL HISTORY:  History   Social History  . Marital Status: Married    Spouse Name: N/A    Number of Children: 3  . Years of Education: college   Occupational History    Retired   Social History Main Topics  . Smoking status: Former  Games developer  . Smokeless tobacco: Never Used     Comment: Quit 48 years ago  . Alcohol Use: No  . Drug Use: No  . Sexual Activity: Not on file   Other Topics Concern  . Not on file   Social History Narrative   Patient lives at home with his wife Joe Fisher)   Retired.   Education college.   Right handed.   Caffeine one cup daily.     PHYSICAL EXAM   Filed Vitals:   02/05/15 1401  BP: 133/75  Pulse: 64  Height:  (1.88 m)  Weight: 182 lb (82.555 kg)    Not recorded      Body mass index is 23.36 kg/(m^2).  PHYSICAL EXAMNIATION:  Gen: NAD, conversant, well nourised, obese, well groomed                     Cardiovascular: Regular rate rhythm, no peripheral edema, warm, nontender. Eyes: Conjunctivae clear without exudates or hemorrhage Neck: Supple, no carotid bruise. Pulmonary: Clear to auscultation bilaterally   NEUROLOGICAL EXAM:  MENTAL STATUS: Speech:    Speech is normal; fluent and spontaneous with normal comprehension.  Cognition: Mini-Mental Status Examination 23 out of 30,    He is not oriented to person, date, place    recent and remote memory: He missed 3 out of 3 recalls    language fluent;     normal attention, concentration,     fund of knowledge.  CRANIAL NERVES: CN II: Visual fields are full to confrontation. Fundoscopic exam is normal with sharp discs and no vascular changes. Pupils were equal round reactive to light CN III, IV, VI: extraocular movement are normal. No ptosis. CN V: Facial sensation is intact to pinprick in all 3 divisions bilaterally. Corneal responses are intact.  CN VII: Face is symmetric with normal eye closure and smile. CN VIII: Hearing is normal to rubbing fingers CN IX, X: Palate elevates symmetrically. Phonation is normal. CN XI: Head turning and shoulder shrug are intact CN XII: Tongue is midline with normal movements and no atrophy.  MOTOR: There is no pronator drift of out-stretched arms. Muscle bulk and tone are  normal. Muscle strength is normal.  REFLEXES: Reflexes are 2+ and symmetric at the biceps, triceps, knees, and ankles. Plantar responses are flexor.  SENSORY: Light touch, pinprick, position sense, and vibration sense are intact in fingers and toes.  COORDINATION: Rapid alternating movements and fine finger movements are intact. There is no dysmetria on finger-to-nose and heel-knee-shin. There are no abnormal or extraneous movements.   GAIT/STANCE: Posture is normal. Gait is steady with normal steps, base, arm swing, and turning. Heel and toe walking are normal. Tandem gait is normal.  Romberg is absent.   DIAGNOSTIC DATA (LABS, IMAGING, TESTING) - I reviewed patient records, labs, notes, testing and imaging myself where available.  Lab Results  Component Value Date   WBC 7.1 01/19/2014   HGB 14.1 01/19/2014   HCT 41.3 01/19/2014   MCV 89 01/19/2014   PLT 269  01/19/2014      Component Value Date/Time   NA 141 01/19/2014 1459   NA 139 10/29/2013 0423   K 4.6 01/19/2014 1459   CL 100 01/19/2014 1459   CO2 28 01/19/2014 1459   GLUCOSE 101* 01/19/2014 1459   GLUCOSE 86 10/29/2013 0423   BUN 24 01/19/2014 1459   BUN 17 10/29/2013 0423   CREATININE 1.05 01/19/2014 1459   CALCIUM 9.5 01/19/2014 1459   PROT 6.4 01/19/2014 1459   PROT 7.9 10/24/2013 1407   ALBUMIN 4.4 01/19/2014 1459   ALBUMIN 4.2 10/24/2013 1407   AST 27 01/19/2014 1459   ALT 20 01/19/2014 1459   ALKPHOS 61 01/19/2014 1459   BILITOT 0.6 01/19/2014 1459   GFRNONAA 67 01/19/2014 1459   GFRAA 78 01/19/2014 1459   No results found for: CHOL No results found for: HGBA1C Lab Results  Component Value Date   VITAMINB12 1389* 10/24/2013   Lab Results  Component Value Date   TSH 2.890 10/24/2013      ASSESSMENT AND PLAN  Jerryl Holzhauer is a 82 y.o. male   Seropositive generalized myasthenia gravis  Since September 2015, presented with neck drop, swallowing difficulty, intermittent diplopia  Responding  well to IVIG  No significant worsening after tapering off prednisone  Continue Imuran 50 mg 2 tablets twice a day  Mild cognitive impairment  Mini-Mental Status Examination 23 out of 30 today  Laboratory evaluations  Return to clinic in 6 months  Consider add on Namenda and Aricept at next visit   Levert Feinstein, M.D. Ph.D.  [facility] [address] [place], [place] [address] [phone]

## 2015-02-06 ENCOUNTER — Encounter: Payer: Self-pay | Admitting: *Deleted

## 2015-02-06 LAB — COMPREHENSIVE METABOLIC PANEL
A/G RATIO: 1.5 (ref 1.1–2.5)
ALT: 14 IU/L (ref 0–44)
AST: 21 IU/L (ref 0–40)
Albumin: 4 g/dL (ref 3.5–4.7)
Alkaline Phosphatase: 65 IU/L (ref 39–117)
BILIRUBIN TOTAL: 0.4 mg/dL (ref 0.0–1.2)
BUN / CREAT RATIO: 25 — AB (ref 10–22)
BUN: 24 mg/dL (ref 8–27)
CO2: 25 mmol/L (ref 18–29)
Calcium: 8.9 mg/dL (ref 8.6–10.2)
Chloride: 101 mmol/L (ref 96–106)
Creatinine, Ser: 0.96 mg/dL (ref 0.76–1.27)
GFR calc Af Amer: 86 mL/min/{1.73_m2} (ref >59)
GFR calc non Af Amer: 74 mL/min/{1.73_m2} (ref >59)
Globulin, Total: 2.6 g/dL (ref 1.5–4.5)
Glucose: 89 mg/dL (ref 65–99)
POTASSIUM: 4.7 mmol/L (ref 3.5–5.2)
SODIUM: 141 mmol/L (ref 134–144)
Total Protein: 6.6 g/dL (ref 6.0–8.5)

## 2015-02-06 LAB — THYROID PANEL WITH TSH
Free Thyroxine Index: 2.4 (ref 1.2–4.9)
T3 Uptake Ratio: 31 % (ref 24–39)
T4 TOTAL: 7.7 ug/dL (ref 4.5–12.0)
TSH: 2.99 u[IU]/mL (ref 0.450–4.500)

## 2015-02-06 LAB — CBC
Hematocrit: 40.2 % (ref 37.5–51.0)
Hemoglobin: 14 g/dL (ref 12.6–17.7)
MCH: 32.6 pg (ref 26.6–33.0)
MCHC: 34.8 g/dL (ref 31.5–35.7)
MCV: 94 fL (ref 79–97)
Platelets: 274 10*3/uL (ref 150–379)
RBC: 4.3 x10E6/uL (ref 4.14–5.80)
RDW: 15.4 % (ref 12.3–15.4)
WBC: 9.2 10*3/uL (ref 3.4–10.8)

## 2015-02-06 LAB — RPR: RPR: NONREACTIVE

## 2015-02-06 LAB — VITAMIN B12: VITAMIN B 12: 744 pg/mL (ref 211–946)

## 2015-03-17 ENCOUNTER — Other Ambulatory Visit: Payer: Self-pay | Admitting: Neurology

## 2015-03-26 ENCOUNTER — Telehealth: Payer: Self-pay | Admitting: Neurology

## 2015-03-26 NOTE — Telephone Encounter (Signed)
Called the pharmacy - they will have his 90 day supply ready for pick up - $30 copay - pt's wife (on HIPPA) aware.

## 2015-03-26 NOTE — Telephone Encounter (Signed)
Pt needs refill on azaTHIOprine (IMURAN) 50 MG tablet for a 3 month supply

## 2015-07-27 IMAGING — CT CT CHEST W/O CM
3 of 4 series · 16 of 30 positions shown, 18 images · non-contrast
Comparison: None.

CLINICAL DATA: New diagnosis of myasthenia gravis 10/27/2013.
Evaluate for possible thymoma.

EXAM:
CT CHEST WITHOUT CONTRAST
TECHNIQUE: Multidetector CT imaging of the chest was performed following the
standard protocol without IV contrast..

[Series 3: chest w/o · axial · non-contrast · 0.70mm/px · z∈[-246,-26]mm · 5 of 68 slices shown, 7 images]
[im 12/68  mediastinal]
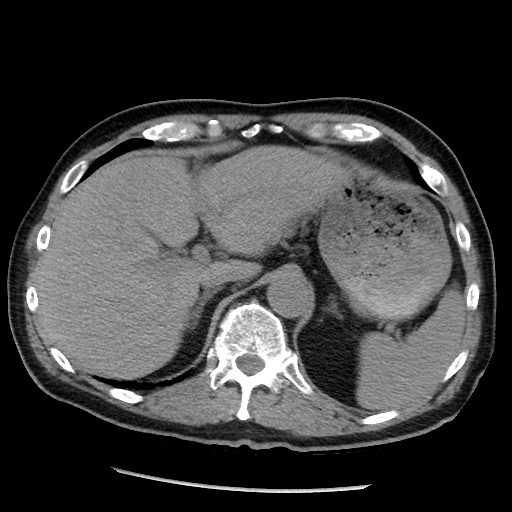
[im 12/68  lung]
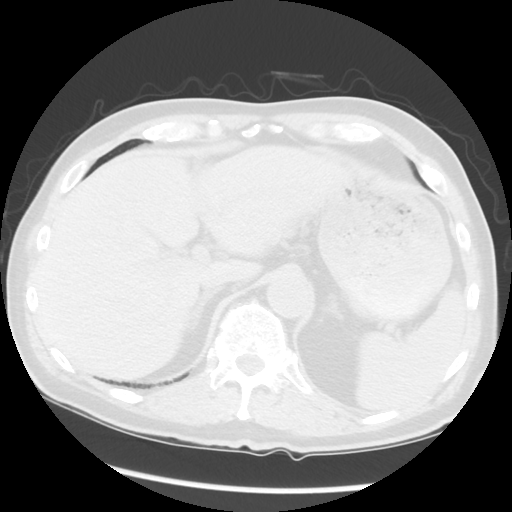
[im 23/68  lung]
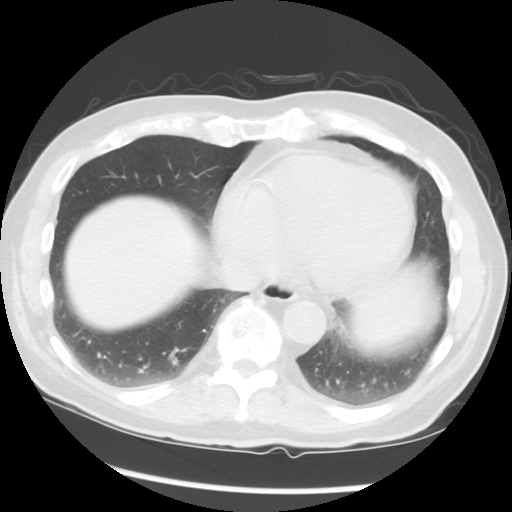
[im 34/68  lung]
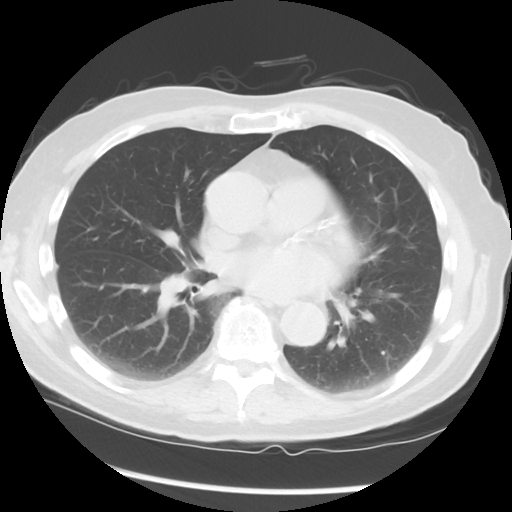
[im 45/68  lung]
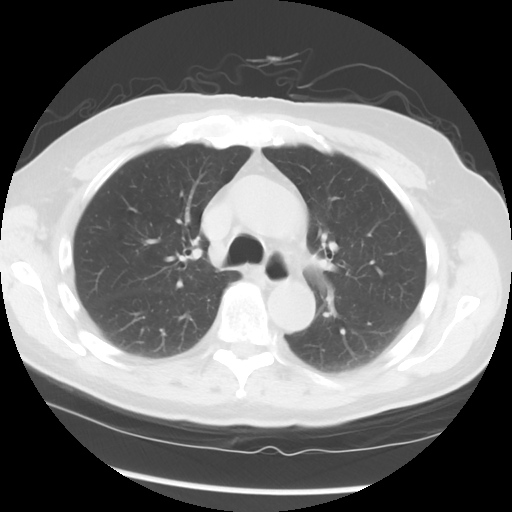
[im 56/68  mediastinal]
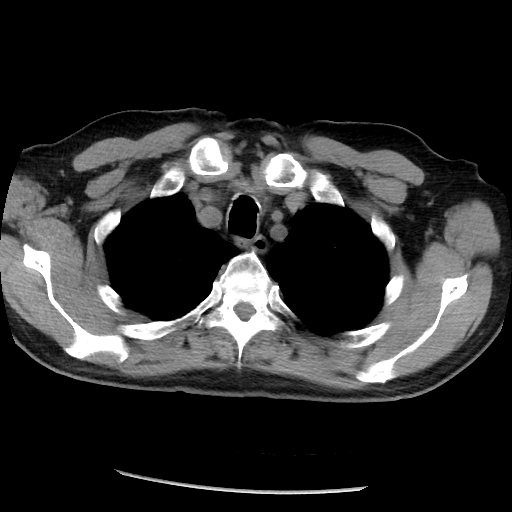
[im 56/68  lung]
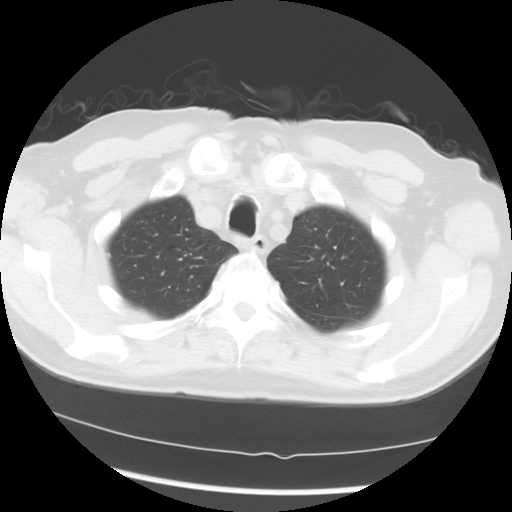

[Series 4: lung windows · axial · 0.70mm/px · z∈[-246,-26]mm · 5 of 68 slices shown]
[im 12/68  lung]
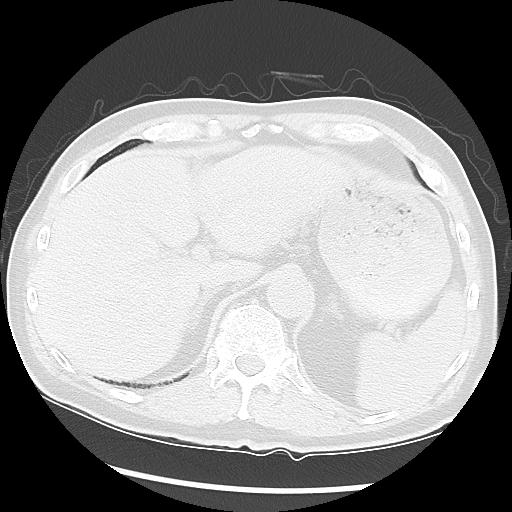
[im 23/68  lung]
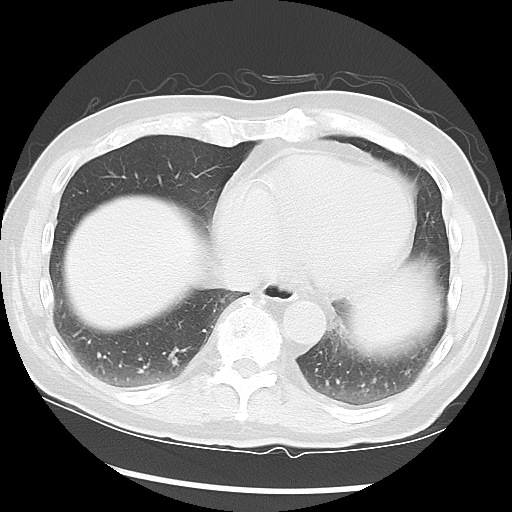
[im 34/68  lung]
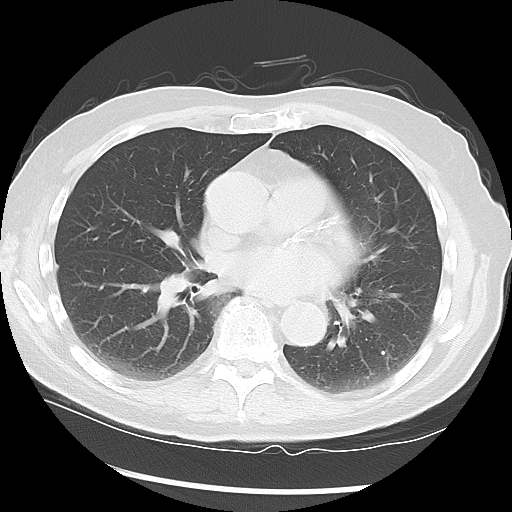
[im 45/68  lung]
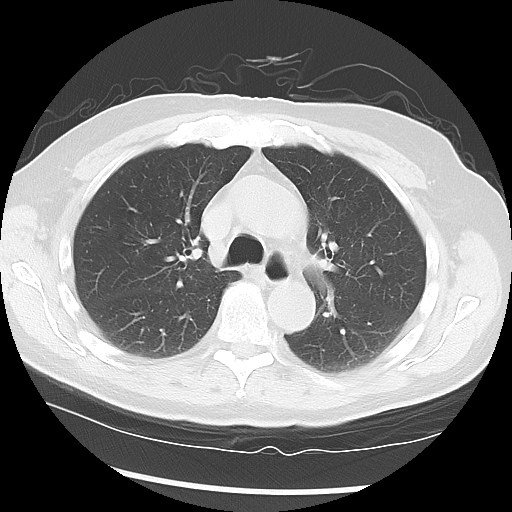
[im 56/68  lung]
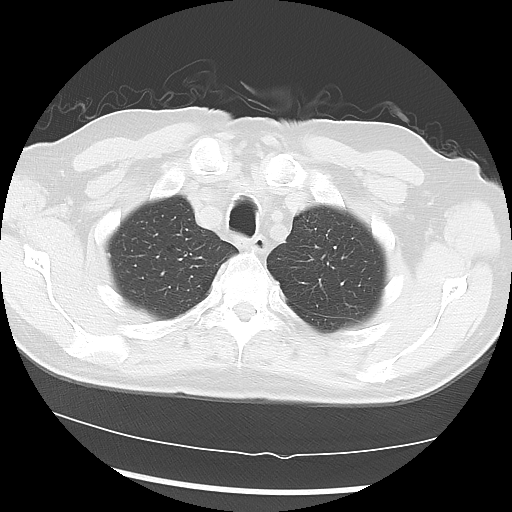

[Series 602: sagittal body · sagittal · 0.70mm/px · 6 of 145 slices shown]
[im 12/145  mediastinal]
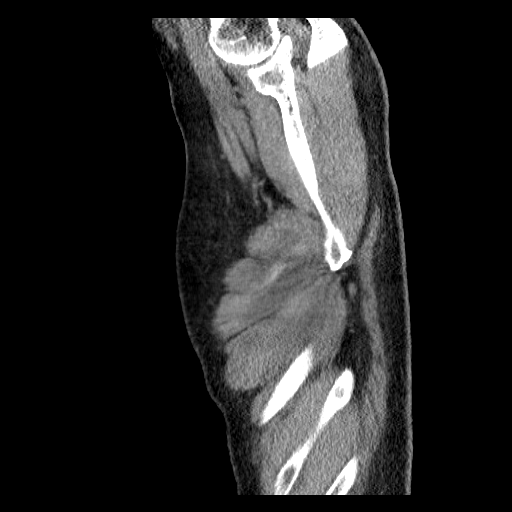
[im 34/145  mediastinal]
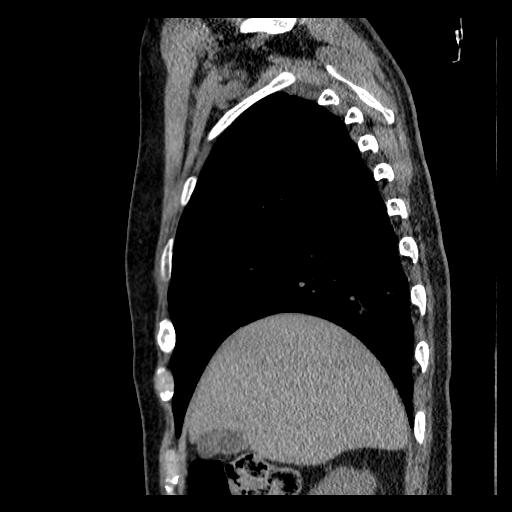
[im 45/145  mediastinal]
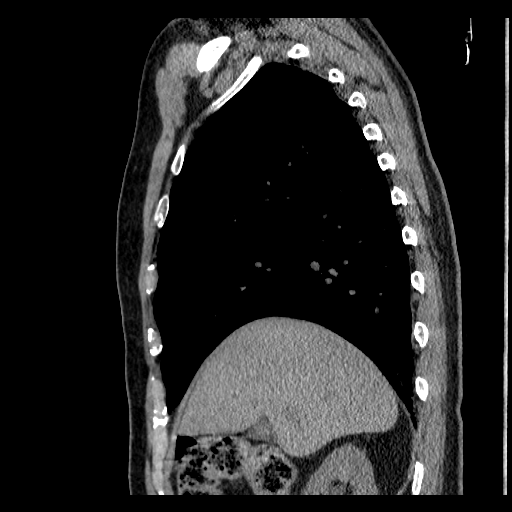
[im 67/145  mediastinal]
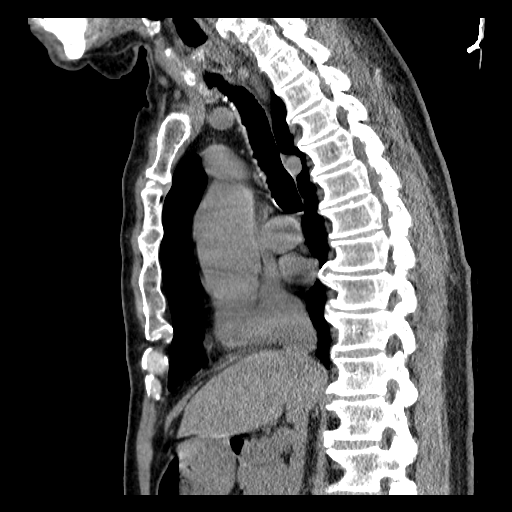
[im 78/145  mediastinal]
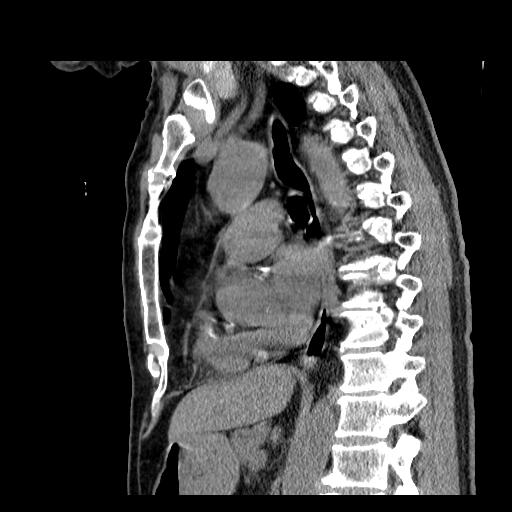
[im 100/145  mediastinal]
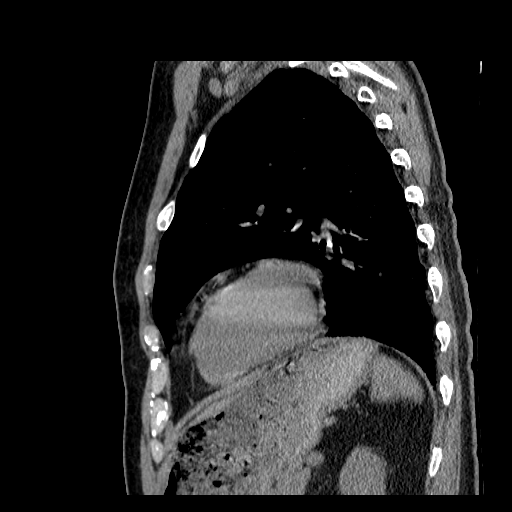

[16 of 30 positions shown; findings below may reference images not displayed]

FINDINGS: No evidence of anterior mediastinal mass to suggest thymoma. No
visible residual thymic tissue in the anterior superior mediastinum.

Emphysematous changes in the upper lobes. Expected dependent
atelectasis posteriorly in the lower lobes. Lungs otherwise clear
without localized airspace consolidation, interstitial disease, or
parenchymal nodules or masses. No pleural effusions. Central airways
patent with mild bronchial wall thickening.

Cardiac silhouette enlarged with left ventricular predominance.
Moderate to severe 3 vessel coronary atherosclerosis. No pericardial
effusion. Mild aortic annular calcification and minimal aortic
valvular calcification. Ectasia of the ascending thoracic aorta,
maximum diameter approximating 4.4 cm. Mild atherosclerosis
involving the thoracic aorta.

No significant mediastinal, hilar or axillary lymphadenopathy.
Thyroid gland normal in appearance.

Fullness of the collecting systems of both kidneys in their
visualized upper poles versus parapelvic cysts. Visualized upper
abdomen otherwise unremarkable for the unenhanced technique. Bone
window images demonstrate diffuse thoracic spondylosis.
IMPRESSION: 1. No evidence of thymoma. No evidence of mediastinal mass or
lymphadenopathy.
2. COPD/emphysema.  No acute cardiopulmonary disease.
3. Cardiomegaly with left ventricular predominance. Three vessel
coronary atherosclerosis. Minimal aortic valvular calcification.
4. Ectasia of the ascending thoracic aorta, maximum diameter 4.4 cm.
5. Fullness of the collecting systems of the visualized upper poles
of both kidneys versus parapelvic cysts. I note that the patient
recently had normal renal function tests. If further imaging is felt
necessary, renal ultrasound may be helpful.

## 2015-08-06 ENCOUNTER — Ambulatory Visit: Payer: Medicare Other | Admitting: Neurology

## 2015-09-03 ENCOUNTER — Ambulatory Visit: Payer: Medicare Other | Admitting: Neurology

## 2015-09-14 DEATH — deceased
